# Patient Record
Sex: Male | Born: 1965
Health system: Southern US, Community
[De-identification: ages and names within clinical notes are randomized; demographics above are authoritative.]

## PROBLEM LIST (undated history)

## (undated) DIAGNOSIS — E119 Type 2 diabetes mellitus without complications: Secondary | ICD-10-CM

## (undated) DIAGNOSIS — N433 Hydrocele, unspecified: Secondary | ICD-10-CM

## (undated) DIAGNOSIS — Z8719 Personal history of other diseases of the digestive system: Secondary | ICD-10-CM

## (undated) DIAGNOSIS — Z8709 Personal history of other diseases of the respiratory system: Secondary | ICD-10-CM

## (undated) DIAGNOSIS — E785 Hyperlipidemia, unspecified: Secondary | ICD-10-CM

## (undated) DIAGNOSIS — Z889 Allergy status to unspecified drugs, medicaments and biological substances status: Secondary | ICD-10-CM

## (undated) DIAGNOSIS — J45909 Unspecified asthma, uncomplicated: Secondary | ICD-10-CM

## (undated) HISTORY — DX: Type 2 diabetes mellitus without complications: E11.9

## (undated) HISTORY — PX: APPENDECTOMY: SHX54

---

## 2014-07-02 ENCOUNTER — Encounter (HOSPITAL_BASED_OUTPATIENT_CLINIC_OR_DEPARTMENT_OTHER): Payer: Self-pay | Admitting: *Deleted

## 2014-07-02 ENCOUNTER — Emergency Department (HOSPITAL_BASED_OUTPATIENT_CLINIC_OR_DEPARTMENT_OTHER)
Admission: EM | Admit: 2014-07-02 | Discharge: 2014-07-02 | Disposition: A | Discharge status: Home / Self Care | Payer: 59 | Attending: Emergency Medicine | Admitting: Emergency Medicine

## 2014-07-02 DIAGNOSIS — J309 Allergic rhinitis, unspecified: Secondary | ICD-10-CM | POA: Insufficient documentation

## 2014-07-02 DIAGNOSIS — K649 Unspecified hemorrhoids: Secondary | ICD-10-CM

## 2014-07-02 DIAGNOSIS — R195 Other fecal abnormalities: Secondary | ICD-10-CM | POA: Diagnosis not present

## 2014-07-02 DIAGNOSIS — K648 Other hemorrhoids: Secondary | ICD-10-CM | POA: Diagnosis not present

## 2014-07-02 DIAGNOSIS — R05 Cough: Secondary | ICD-10-CM | POA: Diagnosis present

## 2014-07-02 DIAGNOSIS — J302 Other seasonal allergic rhinitis: Secondary | ICD-10-CM

## 2014-07-02 MED ORDER — STARCH 51 % RE SUPP: 1.0000 | RECTAL | Status: DC | PRN

## 2014-07-02 MED ORDER — ALBUTEROL SULFATE HFA 108 (90 BASE) MCG/ACT IN AERS
2.0000 | INHALATION_SPRAY | RESPIRATORY_TRACT | Status: DC | PRN
  Administered 2014-07-02: 2 via RESPIRATORY_TRACT
  Filled 2014-07-02: qty 6.7

## 2014-07-02 MED ORDER — LORATADINE 10 MG PO TABS: 10.0000 mg | ORAL_TABLET | Freq: Every day | ORAL | Status: DC

## 2014-07-02 NOTE — Discharge Instructions (Signed)

## 2014-07-02 NOTE — ED Provider Notes (Signed)
CSN: [id number]     Arrival date & time 07/02/14  1046 History   First MD Initiated Contact with Patient 07/02/14 1102     No chief complaint on file.    (Consider location/radiation/quality/duration/timing/severity/associated sxs/prior Treatment) Patient is a 49 y.o. male presenting with URI. The history is provided by the patient.  URI Presenting symptoms: cough   Severity:  Moderate Onset quality:  Gradual Duration:  2 weeks Timing:  Constant Progression:  Waxing and waning Chronicity:  Recurrent Relieved by:  Nothing Worsened by:  Nothing tried Ineffective treatments: nyquil. Associated symptoms: headaches, sinus pain, sneezing and wheezing   Associated symptoms comment:  Nasal discharge also with bloody mucous drainage from the nose. Feeling drainage down the back of his throat looked causes of cough and occasional shortness of breath especially while he is at work. Patient states he gets allergies every year but this year are worse than normal Risk factors: no chronic cardiac disease, no chronic kidney disease, no chronic respiratory disease, no diabetes mellitus, no recent illness, no recent travel and no sick contacts     History reviewed. No pertinent past medical history. Past Surgical History  Procedure Laterality Date  . Appendectomy     No family history on file. History  Substance Use Topics  . Smoking status: Never Smoker   . Smokeless tobacco: Not on file  . Alcohol Use: No    Review of Systems  HENT: Positive for sneezing.   Respiratory: Positive for cough, shortness of breath and wheezing.   Cardiovascular: Negative for chest pain, palpitations and leg swelling.  Gastrointestinal: Positive for constipation and blood in stool. Negative for nausea, vomiting and abdominal pain.  Musculoskeletal:       Occasional sharp electric type pain shooting down in his right arm with certain movements that occur 2-3 times a week it is not consistent. No weakness or neck  pain  Neurological: Positive for headaches.  All other systems reviewed and are negative.     Allergies  Review of patient's allergies indicates no known allergies.  Home Medications   Prior to Admission medications   Medication Sig Start Date End Date Taking? Authorizing Provider  loratadine (CLARITIN) 10 MG tablet Take 1 tablet (10 mg total) by mouth daily. 07/02/14   Gwyneth Sprout, MD  starch (ANUSOL) 51 % suppository Place 1 suppository rectally as needed for pain. 07/02/14   Gwyneth Sprout, MD   BP 146/92 mmHg  Pulse 87  Temp(Src) 98.1 F (36.7 C) (Oral)  Resp 20  Ht  (1.702 m)  Wt 191 lb (86.637 kg)  BMI 29.91 kg/m2  SpO2 97% Physical Exam  Constitutional: He is oriented to person, place, and time. He appears well-developed and well-nourished. No distress.  HENT:  Head: Normocephalic and atraumatic.  Right Ear: A middle ear effusion is present.  Left Ear: A middle ear effusion is present.  Nose: Mucosal edema and rhinorrhea present.  Mouth/Throat: Oropharynx is clear and moist and mucous membranes are normal.  Dried blood in both nares  Eyes: Conjunctivae and EOM are normal. Pupils are equal, round, and reactive to light.  Neck: Normal range of motion. Neck supple.  Cardiovascular: Normal rate, regular rhythm and intact distal pulses.   No murmur heard. Pulmonary/Chest: Effort normal and breath sounds normal. No respiratory distress. He has no wheezes. He has no rales.  Abdominal: Soft. He exhibits no distension. There is no tenderness. There is no rebound and no guarding.  Genitourinary: Rectal exam shows internal  hemorrhoid. Rectal exam shows no mass and no tenderness.  Musculoskeletal: Normal range of motion. He exhibits no edema or tenderness.  Neurological: He is alert and oriented to person, place, and time.  Skin: Skin is warm and dry. No rash noted. No erythema.  Psychiatric: He has a normal mood and affect. His behavior is normal.  Nursing note and  vitals reviewed.   ED Course  Procedures (including critical care time) Labs Review Labs Reviewed - No data to display  Imaging Review No results found.   EKG Interpretation None      MDM   Final diagnoses:  Seasonal allergies  Hemorrhoids, unspecified hemorrhoid type    Patient here with symptoms most consistent with allergies. Over the last few weeks he's had significant nasal drainage that has some blood in it as well as throat drainage, coughing and shortness of breath. He is only taking NyQuil without improvement. On exam patient has normal vital signs and is no acute distress. He has no wheezing on exam and currently denies any shortness of breath. Discussed with him starting Claritin or Zyrtec as well as given an albuterol inhaler to use when necessary for shortness of breath.  Has noninflamed hemorrhoids on rectal exam. No current bleeding at this time. He at one time was on Anusol but ran out a while ago. He does have intermittent heart stools and he was encouraged to drink lots of water as well as eating fruits and vegetables.    Gwyneth SproutWhitney Seabron Iannello, MD 07/02/14 1301

## 2014-07-02 NOTE — ED Notes (Signed)
Allergies and sob for a week per pt. States he has been taking OTC medication without relief. Feels like an electric shock is in his right arm for 2 months. It causes his arm to get weak when it comes about 3 times a week while he works. Pain in his anus for 3 months. States he wipes and sees blood. Stools are hard.

## 2015-06-26 DIAGNOSIS — Z8709 Personal history of other diseases of the respiratory system: Secondary | ICD-10-CM

## 2015-06-26 HISTORY — DX: Personal history of other diseases of the respiratory system: Z87.09

## 2016-05-22 DIAGNOSIS — B353 Tinea pedis: Secondary | ICD-10-CM | POA: Diagnosis not present

## 2016-07-10 DIAGNOSIS — J45909 Unspecified asthma, uncomplicated: Secondary | ICD-10-CM | POA: Diagnosis not present

## 2016-07-10 DIAGNOSIS — J309 Allergic rhinitis, unspecified: Secondary | ICD-10-CM | POA: Diagnosis not present

## 2016-08-07 DIAGNOSIS — Z0000 Encounter for general adult medical examination without abnormal findings: Secondary | ICD-10-CM | POA: Diagnosis not present

## 2016-08-07 DIAGNOSIS — Z01118 Encounter for examination of ears and hearing with other abnormal findings: Secondary | ICD-10-CM | POA: Diagnosis not present

## 2016-08-07 DIAGNOSIS — Z136 Encounter for screening for cardiovascular disorders: Secondary | ICD-10-CM | POA: Diagnosis not present

## 2016-08-07 DIAGNOSIS — Z131 Encounter for screening for diabetes mellitus: Secondary | ICD-10-CM | POA: Diagnosis not present

## 2016-11-06 DIAGNOSIS — J301 Allergic rhinitis due to pollen: Secondary | ICD-10-CM | POA: Diagnosis not present

## 2016-11-06 DIAGNOSIS — J4530 Mild persistent asthma, uncomplicated: Secondary | ICD-10-CM | POA: Diagnosis not present

## 2016-11-06 DIAGNOSIS — H1013 Acute atopic conjunctivitis, bilateral: Secondary | ICD-10-CM | POA: Diagnosis not present

## 2016-11-23 DIAGNOSIS — J301 Allergic rhinitis due to pollen: Secondary | ICD-10-CM | POA: Diagnosis not present

## 2016-12-04 DIAGNOSIS — J301 Allergic rhinitis due to pollen: Secondary | ICD-10-CM | POA: Diagnosis not present

## 2016-12-04 DIAGNOSIS — J3089 Other allergic rhinitis: Secondary | ICD-10-CM | POA: Diagnosis not present

## 2016-12-11 DIAGNOSIS — J301 Allergic rhinitis due to pollen: Secondary | ICD-10-CM | POA: Diagnosis not present

## 2016-12-18 DIAGNOSIS — J301 Allergic rhinitis due to pollen: Secondary | ICD-10-CM | POA: Diagnosis not present

## 2016-12-25 DIAGNOSIS — J301 Allergic rhinitis due to pollen: Secondary | ICD-10-CM | POA: Diagnosis not present

## 2017-01-01 DIAGNOSIS — H1013 Acute atopic conjunctivitis, bilateral: Secondary | ICD-10-CM | POA: Diagnosis not present

## 2017-01-01 DIAGNOSIS — J301 Allergic rhinitis due to pollen: Secondary | ICD-10-CM | POA: Diagnosis not present

## 2017-01-01 DIAGNOSIS — J4530 Mild persistent asthma, uncomplicated: Secondary | ICD-10-CM | POA: Diagnosis not present

## 2017-01-08 DIAGNOSIS — J301 Allergic rhinitis due to pollen: Secondary | ICD-10-CM | POA: Diagnosis not present

## 2017-01-15 DIAGNOSIS — H1013 Acute atopic conjunctivitis, bilateral: Secondary | ICD-10-CM | POA: Diagnosis not present

## 2017-01-15 DIAGNOSIS — J301 Allergic rhinitis due to pollen: Secondary | ICD-10-CM | POA: Diagnosis not present

## 2017-01-22 DIAGNOSIS — J301 Allergic rhinitis due to pollen: Secondary | ICD-10-CM | POA: Diagnosis not present

## 2017-01-29 DIAGNOSIS — J301 Allergic rhinitis due to pollen: Secondary | ICD-10-CM | POA: Diagnosis not present

## 2017-02-05 DIAGNOSIS — J301 Allergic rhinitis due to pollen: Secondary | ICD-10-CM | POA: Diagnosis not present

## 2017-02-12 DIAGNOSIS — Z7689 Persons encountering health services in other specified circumstances: Secondary | ICD-10-CM | POA: Diagnosis not present

## 2017-02-12 DIAGNOSIS — J301 Allergic rhinitis due to pollen: Secondary | ICD-10-CM | POA: Diagnosis not present

## 2017-02-19 DIAGNOSIS — J301 Allergic rhinitis due to pollen: Secondary | ICD-10-CM | POA: Diagnosis not present

## 2017-03-06 DIAGNOSIS — J301 Allergic rhinitis due to pollen: Secondary | ICD-10-CM | POA: Diagnosis not present

## 2017-03-12 DIAGNOSIS — J301 Allergic rhinitis due to pollen: Secondary | ICD-10-CM | POA: Diagnosis not present

## 2017-03-21 DIAGNOSIS — H1013 Acute atopic conjunctivitis, bilateral: Secondary | ICD-10-CM | POA: Diagnosis not present

## 2017-03-21 DIAGNOSIS — J301 Allergic rhinitis due to pollen: Secondary | ICD-10-CM | POA: Diagnosis not present

## 2017-03-28 DIAGNOSIS — J301 Allergic rhinitis due to pollen: Secondary | ICD-10-CM | POA: Diagnosis not present

## 2017-04-02 DIAGNOSIS — J301 Allergic rhinitis due to pollen: Secondary | ICD-10-CM | POA: Diagnosis not present

## 2017-04-09 DIAGNOSIS — J301 Allergic rhinitis due to pollen: Secondary | ICD-10-CM | POA: Diagnosis not present

## 2017-04-16 DIAGNOSIS — J301 Allergic rhinitis due to pollen: Secondary | ICD-10-CM | POA: Diagnosis not present

## 2017-04-23 DIAGNOSIS — J301 Allergic rhinitis due to pollen: Secondary | ICD-10-CM | POA: Diagnosis not present

## 2017-04-30 DIAGNOSIS — J301 Allergic rhinitis due to pollen: Secondary | ICD-10-CM | POA: Diagnosis not present

## 2017-05-07 DIAGNOSIS — J301 Allergic rhinitis due to pollen: Secondary | ICD-10-CM | POA: Diagnosis not present

## 2017-05-15 DIAGNOSIS — J301 Allergic rhinitis due to pollen: Secondary | ICD-10-CM | POA: Diagnosis not present

## 2017-05-21 DIAGNOSIS — J301 Allergic rhinitis due to pollen: Secondary | ICD-10-CM | POA: Diagnosis not present

## 2017-05-28 DIAGNOSIS — J301 Allergic rhinitis due to pollen: Secondary | ICD-10-CM | POA: Diagnosis not present

## 2017-06-04 DIAGNOSIS — J4530 Mild persistent asthma, uncomplicated: Secondary | ICD-10-CM | POA: Diagnosis not present

## 2017-06-04 DIAGNOSIS — J301 Allergic rhinitis due to pollen: Secondary | ICD-10-CM | POA: Diagnosis not present

## 2017-06-04 DIAGNOSIS — H1013 Acute atopic conjunctivitis, bilateral: Secondary | ICD-10-CM | POA: Diagnosis not present

## 2017-06-12 DIAGNOSIS — J301 Allergic rhinitis due to pollen: Secondary | ICD-10-CM | POA: Diagnosis not present

## 2017-06-20 DIAGNOSIS — H1013 Acute atopic conjunctivitis, bilateral: Secondary | ICD-10-CM | POA: Diagnosis not present

## 2017-06-20 DIAGNOSIS — J301 Allergic rhinitis due to pollen: Secondary | ICD-10-CM | POA: Diagnosis not present

## 2017-06-25 DIAGNOSIS — J301 Allergic rhinitis due to pollen: Secondary | ICD-10-CM | POA: Diagnosis not present

## 2017-07-02 DIAGNOSIS — J301 Allergic rhinitis due to pollen: Secondary | ICD-10-CM | POA: Diagnosis not present

## 2017-07-09 DIAGNOSIS — J301 Allergic rhinitis due to pollen: Secondary | ICD-10-CM | POA: Diagnosis not present

## 2017-07-12 DIAGNOSIS — J301 Allergic rhinitis due to pollen: Secondary | ICD-10-CM | POA: Diagnosis not present

## 2017-07-17 DIAGNOSIS — J301 Allergic rhinitis due to pollen: Secondary | ICD-10-CM | POA: Diagnosis not present

## 2017-07-23 DIAGNOSIS — J301 Allergic rhinitis due to pollen: Secondary | ICD-10-CM | POA: Diagnosis not present

## 2017-07-30 DIAGNOSIS — J301 Allergic rhinitis due to pollen: Secondary | ICD-10-CM | POA: Diagnosis not present

## 2017-08-07 DIAGNOSIS — J301 Allergic rhinitis due to pollen: Secondary | ICD-10-CM | POA: Diagnosis not present

## 2017-08-21 DIAGNOSIS — J301 Allergic rhinitis due to pollen: Secondary | ICD-10-CM | POA: Diagnosis not present

## 2017-09-26 DIAGNOSIS — J301 Allergic rhinitis due to pollen: Secondary | ICD-10-CM | POA: Diagnosis not present

## 2017-10-01 DIAGNOSIS — J301 Allergic rhinitis due to pollen: Secondary | ICD-10-CM | POA: Diagnosis not present

## 2017-10-11 DIAGNOSIS — J301 Allergic rhinitis due to pollen: Secondary | ICD-10-CM | POA: Diagnosis not present

## 2017-10-22 DIAGNOSIS — J45909 Unspecified asthma, uncomplicated: Secondary | ICD-10-CM | POA: Diagnosis not present

## 2017-10-22 DIAGNOSIS — Z0000 Encounter for general adult medical examination without abnormal findings: Secondary | ICD-10-CM | POA: Diagnosis not present

## 2017-10-22 DIAGNOSIS — E785 Hyperlipidemia, unspecified: Secondary | ICD-10-CM | POA: Diagnosis not present

## 2017-10-22 DIAGNOSIS — Z136 Encounter for screening for cardiovascular disorders: Secondary | ICD-10-CM | POA: Diagnosis not present

## 2017-10-22 DIAGNOSIS — Z01118 Encounter for examination of ears and hearing with other abnormal findings: Secondary | ICD-10-CM | POA: Diagnosis not present

## 2017-10-22 DIAGNOSIS — E1165 Type 2 diabetes mellitus with hyperglycemia: Secondary | ICD-10-CM | POA: Diagnosis not present

## 2017-10-24 DIAGNOSIS — J301 Allergic rhinitis due to pollen: Secondary | ICD-10-CM | POA: Diagnosis not present

## 2017-11-05 DIAGNOSIS — J45909 Unspecified asthma, uncomplicated: Secondary | ICD-10-CM | POA: Diagnosis not present

## 2017-11-05 DIAGNOSIS — J301 Allergic rhinitis due to pollen: Secondary | ICD-10-CM | POA: Diagnosis not present

## 2017-11-05 DIAGNOSIS — E1165 Type 2 diabetes mellitus with hyperglycemia: Secondary | ICD-10-CM | POA: Diagnosis not present

## 2017-11-05 DIAGNOSIS — E785 Hyperlipidemia, unspecified: Secondary | ICD-10-CM | POA: Diagnosis not present

## 2017-11-06 NOTE — Progress Notes (Signed)
[facility] - Clinic Note  11/07/2017     CHIEF COMPLAINT Patient presents for Diabetic Eye Exam   HISTORY OF PRESENT ILLNESS: Glen Clark is a 52 y.o. male who presents to the clinic today for:   HPI    Diabetic Eye Exam    Vision is blurred for near and is blurred for distance.  Associated Symptoms Photophobia.  Negative for Flashes, Blind Spot, Scalp Tenderness, Fever, Weight Loss, Jaw Claudication, Glare, Pain, Floaters, Distortion, Redness, Trauma, Shoulder/Hip pain and Fatigue.  Diabetes characteristics include Type 2.  This started 2 weeks ago.  I, the attending physician,  performed the HPI with the patient and updated documentation appropriately.          Comments    Pt presents for DM exam on the referral of Dr. Greggory StallionGeorge Osei-Bonsu, pt was dx 2 weeks ago, pt states he was not told what his A1C was, pt is not checking blood sugar at home, but it was 124 2 weeks ago, pt denies FOL, floaters, pain or wavy vision, pt states it is very hard for him to see when it is sunny outside, pt states he is using Visine gtts PRN, pt states he started taking Metformin this past Monday night       Last edited by Rennis ChrisZamora, Demontez Novack, MD on 11/07/2017  9:31 AM. (History)    Pt states 2 weeks ago he was dx with DM; Pt states last A1C was 5.2 x 2 weeks ago; Pt states he started metformin x 3 days ago;   Referring physician: Jackie Plumsei-Bonsu, George, MD [address] [place], [place] [id number]  HISTORICAL INFORMATION:   Selected notes from the MEDICAL RECORD NUMBER Referred by Dr. Greggory StallionGeorge Osei-Bonsu for DM exam LEE:  Ocular Hx- PMH-DM (last A1C 6.5 no medication on file), high cholesterol, asthma,     CURRENT MEDICATIONS: No current outpatient medications on file. (Ophthalmic Drugs)   No current facility-administered medications for this visit.  (Ophthalmic Drugs)   Current Outpatient Medications (Other)  Medication Sig  . albuterol (PROVENTIL HFA;VENTOLIN HFA) 108 (90  Base) MCG/ACT inhaler Inhale into the lungs.  Marland Kitchen. atorvastatin (LIPITOR) 10 MG tablet Take 10 mg by mouth daily.  Marland Kitchen. loratadine (CLARITIN) 10 MG tablet Take 1 tablet (10 mg total) by mouth daily.  . metFORMIN (GLUCOPHAGE) 500 MG tablet TAKE 1 2 (ONE HALF) TABLET BY MOUTH ONCE DAILY  . starch (ANUSOL) 51 % suppository Place 1 suppository rectally as needed for pain.   No current facility-administered medications for this visit.  (Other)      REVIEW OF SYSTEMS: ROS    Positive for: Eyes   Negative for: Constitutional, Gastrointestinal, Neurological, Skin, Genitourinary, Musculoskeletal, HENT, Endocrine, Cardiovascular, Respiratory, Psychiatric, Allergic/Imm, Heme/Lymph   Last edited by Posey BoyerBrown, Amanda J, COT on 11/07/2017  9:13 AM. (History)       ALLERGIES Allergies  Allergen Reactions  . Aspirin     PAST MEDICAL HISTORY Past Medical History:  Diagnosis Date  . Diabetes mellitus without complication [facility](HCC)    Past Surgical History:  Procedure Laterality Date  . APPENDECTOMY      FAMILY HISTORY Family History  Problem Relation Age of Onset  . Amblyopia Neg Hx   . Blindness Neg Hx   . Cataracts Neg Hx   . Glaucoma Neg Hx   . Macular degeneration Neg Hx   . Retinal detachment Neg Hx   . Strabismus Neg Hx   . Retinitis pigmentosa Neg Hx  SOCIAL HISTORY Social History   Tobacco Use  . Smoking status: Never Smoker  . Smokeless tobacco: Never Used  Substance Use Topics  . Alcohol use: No  . Drug use: No         OPHTHALMIC EXAM:  Base Eye Exam    Visual Acuity (Snellen - Linear)      Right Left   Dist cc 20/20 20/20   Correction:  Glasses       Tonometry (Tonopen, 9:19 AM)      Right Left   Pressure 20 17       Pupils      Dark Light Shape React APD   Right 4 2 Round Brisk None   Left 4 2 Round Brisk None       Visual Fields (Counting fingers)      Left Right    Full Full       Extraocular Movement      Right Left    Full, Ortho Full, Ortho        Neuro/Psych    Oriented x3:  Yes   Mood/Affect:  Normal       Dilation    Both eyes:  1.0% Mydriacyl, 2.5% Phenylephrine @ 9:19 AM        Slit Lamp and Fundus Exam    Slit Lamp Exam      Right Left   Lids/Lashes Meibomian gland dysfunction Meibomian gland dysfunction   Conjunctiva/Sclera Nasal Pinguecula, mild Melanosis Mild Melanosis   Cornea Arcus Arcus   Anterior Chamber Moderate depth, Temporal Narrow angle Moderate depth, Temporal Narrow angle   Iris Round and dilated, No NVI Round and dilated, No NVI   Lens 2+ Nuclear sclerosis, 2+ Cortical cataract 2+ Nuclear sclerosis, 2+ Cortical cataract   Vitreous Vitreous syneresis Vitreous syneresis, prominent vitreous base       Fundus Exam      Right Left   Disc Pink and Sharp Pink and Sharp   C/D Ratio 0.25 0.2   Macula Good foveal reflex, mild Retinal pigment epithelial mottling, No heme or edema Good foveal reflex, mild Retinal pigment epithelial mottling, No heme or edema   Vessels Normal Mild Copper wiring   Periphery Attached, no heme Attached, no heme, Inferior-temporal and Superior-temporal White without pressure / prominent vitreous base        Refraction    Wearing Rx      Sphere Cylinder Add   Right +2.00 Sphere +1.75   Left +2.00 Sphere +1.75          IMAGING AND PROCEDURES  Imaging and Procedures for @TODAY @  OCT, Retina - OU - Both Eyes       Right Eye Quality was good. Central Foveal Thickness: 284. Progression has no prior data. Findings include normal foveal contour, no IRF, no SRF.   Left Eye Quality was good. Central Foveal Thickness: 284. Progression has no prior data. Findings include normal foveal contour, no IRF, no SRF.   Notes *Images captured and stored on drive  Diagnosis / Impression:  No DME OU   Clinical management:  See below  Abbreviations: NFP - Normal foveal profile. CME - cystoid macular edema. PED - pigment epithelial detachment. IRF - intraretinal fluid. SRF -  subretinal fluid. EZ - ellipsoid zone. ERM - epiretinal membrane. ORA - outer retinal atrophy. ORT - outer retinal tubulation. SRHM - subretinal hyper-reflective material                  ASSESSMENT/PLAN:  ICD-10-CM   1. Diabetes mellitus type 2 without retinopathy (HCC) E11.9   2. Retinal edema H35.81 OCT, Retina - OU - Both Eyes  3. Combined form of age-related cataract, both eyes H25.813     1. Diabetes mellitus, type 2 without retinopathy - The incidence, risk factors for progression, natural history and treatment options for diabetic retinopathy  were discussed with patient.   - The need for close monitoring of blood glucose, blood pressure, and serum lipids, avoiding cigarette or any type of tobacco, and the need for long term follow up was also discussed with patient. - f/u in 1 year, sooner prn  2. No retinal edema on exam or OCT  3. Combined form age related cataract OU-  - The symptoms of cataract, surgical options, and treatments and risks were discussed with patient. - discussed diagnosis and progression - not yet visually significant - monitor for now   Ophthalmic Meds Ordered this visit:  No orders of the defined types were placed in this encounter.      Return in about 1 year (around 11/08/2018) for DM exam, DFE, OCT.  There are no Patient Instructions on file for this visit.   Explained the diagnoses, plan, and follow up with the patient and they expressed understanding.  Patient expressed understanding of the importance of proper follow up care.   This document serves as a record of services personally performed by Karie ChimeraBrian G. Edison Nicholson, MD, PhD. It was created on their behalf by Laurian BrimAmanda Brown, OA, an ophthalmic assistant. The creation of this record is the provider's dictation and/or activities during the visit.    Electronically signed by: Laurian BrimAmanda Brown, OA  08.13.2019 9:50 AM   This document serves as a record of services personally performed by Karie ChimeraBrian  G. Patryce Depriest, MD, PhD. It was created on their behalf by Virgilio BellingMeredith Fabian, COA, a certified ophthalmic assistant. The creation of this record is the provider's dictation and/or activities during the visit.  Electronically signed by: Virgilio BellingMeredith Fabian, COA  08.14.19 9:50 AM    Karie ChimeraBrian G. Lachelle Rissler, M.D., Ph.D. Diseases & Surgery of the Retina and Vitreous [facility]  I have reviewed the above documentation for accuracy and completeness, and I agree with the above. Karie ChimeraBrian G. Kasidee Voisin, M.D., Ph.D. 11/07/17 9:50 AM     Abbreviations: M myopia (nearsighted); A astigmatism; H hyperopia (farsighted); P presbyopia; Mrx spectacle prescription;  CTL contact lenses; OD right eye; OS left eye; OU both eyes  XT exotropia; ET esotropia; PEK punctate epithelial keratitis; PEE punctate epithelial erosions; DES dry eye syndrome; MGD meibomian gland dysfunction; ATs artificial tears; PFAT's preservative free artificial tears; NSC nuclear sclerotic cataract; PSC posterior subcapsular cataract; ERM epi-retinal membrane; PVD posterior vitreous detachment; RD retinal detachment; DM diabetes mellitus; DR diabetic retinopathy; NPDR non-proliferative diabetic retinopathy; PDR proliferative diabetic retinopathy; CSME clinically significant macular edema; DME diabetic macular edema; dbh dot blot hemorrhages; CWS cotton wool spot; POAG primary open angle glaucoma; C/D cup-to-disc ratio; HVF humphrey visual field; GVF goldmann visual field; OCT optical coherence tomography; IOP intraocular pressure; BRVO Branch retinal vein occlusion; CRVO central retinal vein occlusion; CRAO central retinal artery occlusion; BRAO branch retinal artery occlusion; RT retinal tear; SB scleral buckle; PPV pars plana vitrectomy; VH Vitreous hemorrhage; PRP panretinal laser photocoagulation; IVK intravitreal kenalog; VMT vitreomacular traction; MH Macular hole;  NVD neovascularization of the disc; NVE neovascularization elsewhere; AREDS age  related eye disease study; ARMD age related macular degeneration; POAG primary open angle glaucoma; EBMD epithelial/anterior  basement membrane dystrophy; ACIOL anterior chamber intraocular lens; IOL intraocular lens; PCIOL posterior chamber intraocular lens; Phaco/IOL phacoemulsification with intraocular lens placement; [place] photorefractive keratectomy; LASIK laser assisted in situ keratomileusis; HTN hypertension; DM diabetes mellitus; COPD chronic obstructive pulmonary disease

## 2017-11-07 ENCOUNTER — Ambulatory Visit (INDEPENDENT_AMBULATORY_CARE_PROVIDER_SITE_OTHER): Payer: 59 | Admitting: Ophthalmology

## 2017-11-07 ENCOUNTER — Encounter (INDEPENDENT_AMBULATORY_CARE_PROVIDER_SITE_OTHER): Payer: Self-pay | Admitting: Ophthalmology

## 2017-11-07 DIAGNOSIS — E119 Type 2 diabetes mellitus without complications: Secondary | ICD-10-CM | POA: Diagnosis not present

## 2017-11-07 DIAGNOSIS — H3581 Retinal edema: Secondary | ICD-10-CM | POA: Diagnosis not present

## 2017-11-07 DIAGNOSIS — H25813 Combined forms of age-related cataract, bilateral: Secondary | ICD-10-CM

## 2017-11-19 DIAGNOSIS — R102 Pelvic and perineal pain: Secondary | ICD-10-CM | POA: Diagnosis not present

## 2017-11-19 DIAGNOSIS — N433 Hydrocele, unspecified: Secondary | ICD-10-CM | POA: Diagnosis not present

## 2017-12-03 ENCOUNTER — Encounter: Payer: 59 | Attending: Internal Medicine | Admitting: Registered"

## 2017-12-03 ENCOUNTER — Encounter: Payer: Self-pay | Admitting: Registered"

## 2017-12-03 DIAGNOSIS — E119 Type 2 diabetes mellitus without complications: Secondary | ICD-10-CM | POA: Insufficient documentation

## 2017-12-03 DIAGNOSIS — Z713 Dietary counseling and surveillance: Secondary | ICD-10-CM | POA: Insufficient documentation

## 2017-12-03 NOTE — Progress Notes (Signed)
Diabetes Self-Management Education  Visit Type: First/Initial  Appt. Start Time: 0930 Appt. End Time: 1035  12/03/2017  Mr. Glen Clark, identified by name and date of birth, is a 52 y.o. male with a diagnosis of Diabetes: Type 2.   ASSESSMENT Pt states his blood sugar has improved since is first visit with Dr. Julio Sicks, but did not have the values to share with me. Pt knew the number was 126 but RD is not clear if that was a fasting number or if it was eAGmg/dL from the Z6X. RD tried to educate on the numbers, but patient seemed frustrated with explanation, not sure if it was a language or cultural barrier.   Pt states after getting the diagnosis he cut back on carbohydrates. Pt states he was eating traditional African food with casava, and rice, cut out the casava and cut back on the rice and started eating more vegetables. Pt states he has never been a soda drinker.   Pt states he stopped eating eggs and peanuts due to cholesterol, states his doctor did not give him this instruction, he did it on his own.   Pt states he works 3:30 p - 2 am. Pt reports timing meals to before work, one meal during work, and after work he showers then eats before going to bed. Pt   Diabetes Self-Management Education - 12/03/17 0944      Visit Information   Visit Type  First/Initial      Initial Visit   Diabetes Type  Type 2    Are you currently following a meal plan?  No    Are you taking your medications as prescribed?  Yes      Health Coping   How would you rate your overall health?  Good      Psychosocial Assessment   Patient Belief/Attitude about Diabetes  Afraid    How often do you need to have someone help you when you read instructions, pamphlets, or other written materials from your doctor or pharmacy?  1 - Never    What is the last grade level you completed in school?  BSC      Complications   Last HgB A1C per patient/outside source  6.5 %   per referral lab 07/2017   How often do  you check your blood sugar?  0 times/day (not testing)    Have you had a dilated eye exam in the past 12 months?  Yes    Have you had a dental exam in the past 12 months?  Yes    Are you checking your feet?  Yes    How many days per week are you checking your feet?  3      Dietary Intake   Breakfast  wheat bread, soup OR cheerios cereal     Snack (morning)  none    Lunch  rice, chick-fil-a salad chicken, fruit    Snack (afternoon)  none    Dinner  wheat, vegetable soup, fish    Snack (evening)  none    Beverage(s)  water      Exercise   Exercise Type  ADL's   active at work    How many days per week to you exercise?  0    How many minutes per day do you exercise?  0    Total minutes per week of exercise  0      Patient Education   Previous Diabetes Education  No    Disease state  Definition of diabetes, type 1 and 2, and the diagnosis of diabetes    Nutrition management   Role of diet in the treatment of diabetes and the relationship between the three main macronutrients and blood glucose level;Food label reading, portion sizes and measuring food.    Physical activity and exercise   Role of exercise on diabetes management, blood pressure control and cardiac health.    Monitoring  Interpreting lab values - A1C, lipid, urine microalbumina.   A1c   Acute complications  Taught treatment of hypoglycemia - the 15 rule.    Psychosocial adjustment  Role of stress on diabetes      Individualized Goals (developed by patient)   Nutrition  General guidelines for healthy choices and portions discussed    Monitoring   test my blood glucose as discussed      Outcomes   Expected Outcomes  Demonstrated interest in learning. Expect positive outcomes    Future DMSE  PRN    Program Status  Completed      Individualized Plan for Diabetes Self-Management Training:   Learning Objective:  Patient will have a greater understanding of diabetes self-management. Patient education plan is to attend  individual and/or group sessions per assessed needs and concerns.   Patient Instructions  When you have fainted while using the restroom it may be a sign of Micturion Syncope. Consider looking it up and asking your doctor about it. Continue to eat 3 balanced meals per day  It sounds like you have balanced your carbohydrates well. If you start feeling low energy you may have cut it back too much. Check your blood if you have low blood sugar symptoms, check and if below 70 treat with 15 grams of fast acting carbohydrates.   Expected Outcomes:  Demonstrated interest in learning. Expect positive outcomes  Education material provided: ADA Diabetes: Your Take Control Guide  If problems or questions, patient to contact team via:  Phone  Future DSME appointment: PRN

## 2017-12-03 NOTE — Patient Instructions (Addendum)
When you have fainted while using the restroom it may be a sign of Micturion Syncope. Consider looking it up and asking your doctor about it. Continue to eat 3 balanced meals per day  It sounds like you have balanced your carbohydrates well. If you start feeling low energy you may have cut it back too much. Check your blood if you have low blood sugar symptoms, check and if below 70 treat with 15 grams of fast acting carbohydrates.

## 2017-12-10 DIAGNOSIS — J301 Allergic rhinitis due to pollen: Secondary | ICD-10-CM | POA: Diagnosis not present

## 2017-12-31 DIAGNOSIS — H1013 Acute atopic conjunctivitis, bilateral: Secondary | ICD-10-CM | POA: Diagnosis not present

## 2017-12-31 DIAGNOSIS — J301 Allergic rhinitis due to pollen: Secondary | ICD-10-CM | POA: Diagnosis not present

## 2017-12-31 DIAGNOSIS — J4530 Mild persistent asthma, uncomplicated: Secondary | ICD-10-CM | POA: Diagnosis not present

## 2018-01-07 DIAGNOSIS — E785 Hyperlipidemia, unspecified: Secondary | ICD-10-CM | POA: Diagnosis not present

## 2018-01-07 DIAGNOSIS — E1165 Type 2 diabetes mellitus with hyperglycemia: Secondary | ICD-10-CM | POA: Diagnosis not present

## 2018-01-07 DIAGNOSIS — J309 Allergic rhinitis, unspecified: Secondary | ICD-10-CM | POA: Diagnosis not present

## 2018-01-07 DIAGNOSIS — J45909 Unspecified asthma, uncomplicated: Secondary | ICD-10-CM | POA: Diagnosis not present

## 2018-01-08 DIAGNOSIS — J301 Allergic rhinitis due to pollen: Secondary | ICD-10-CM | POA: Diagnosis not present

## 2018-01-21 DIAGNOSIS — J301 Allergic rhinitis due to pollen: Secondary | ICD-10-CM | POA: Diagnosis not present

## 2018-01-28 DIAGNOSIS — E785 Hyperlipidemia, unspecified: Secondary | ICD-10-CM | POA: Diagnosis not present

## 2018-01-28 DIAGNOSIS — J45909 Unspecified asthma, uncomplicated: Secondary | ICD-10-CM | POA: Diagnosis not present

## 2018-01-28 DIAGNOSIS — E1165 Type 2 diabetes mellitus with hyperglycemia: Secondary | ICD-10-CM | POA: Diagnosis not present

## 2018-02-12 ENCOUNTER — Encounter (HOSPITAL_BASED_OUTPATIENT_CLINIC_OR_DEPARTMENT_OTHER): Payer: Self-pay

## 2018-02-12 ENCOUNTER — Other Ambulatory Visit: Payer: Self-pay | Admitting: Urology

## 2018-02-14 ENCOUNTER — Encounter (HOSPITAL_BASED_OUTPATIENT_CLINIC_OR_DEPARTMENT_OTHER): Payer: Self-pay

## 2018-02-14 ENCOUNTER — Other Ambulatory Visit: Payer: Self-pay

## 2018-02-14 NOTE — Progress Notes (Signed)
Spoke with: Gaspar GarbeAlfred NPO:  After Midnight, no gum, candy, or mints   Arrival time:  10AM Labs:  Istat 4, EKG AM medications: Inhaler, Bring Inhaler day of surgery Pre op orders: Yes Ride home: Lady GaryOlayinka (wife) [phone]

## 2018-02-18 ENCOUNTER — Encounter (HOSPITAL_BASED_OUTPATIENT_CLINIC_OR_DEPARTMENT_OTHER): Payer: Self-pay

## 2018-02-18 ENCOUNTER — Ambulatory Visit (HOSPITAL_BASED_OUTPATIENT_CLINIC_OR_DEPARTMENT_OTHER): Payer: 59 | Admitting: Anesthesiology

## 2018-02-18 ENCOUNTER — Ambulatory Visit (HOSPITAL_BASED_OUTPATIENT_CLINIC_OR_DEPARTMENT_OTHER)
Admission: RE | Admit: 2018-02-18 | Discharge: 2018-02-18 | Disposition: A | Discharge status: Home / Self Care | Payer: 59 | Source: Ambulatory Visit | Attending: Urology | Admitting: Urology

## 2018-02-18 ENCOUNTER — Encounter (HOSPITAL_BASED_OUTPATIENT_CLINIC_OR_DEPARTMENT_OTHER)
Admission: RE | Disposition: A | Discharge status: Home / Self Care | Payer: Self-pay | Source: Ambulatory Visit | Attending: Urology

## 2018-02-18 DIAGNOSIS — J45909 Unspecified asthma, uncomplicated: Secondary | ICD-10-CM | POA: Diagnosis not present

## 2018-02-18 DIAGNOSIS — E785 Hyperlipidemia, unspecified: Secondary | ICD-10-CM | POA: Insufficient documentation

## 2018-02-18 DIAGNOSIS — Z79899 Other long term (current) drug therapy: Secondary | ICD-10-CM | POA: Insufficient documentation

## 2018-02-18 DIAGNOSIS — N5089 Other specified disorders of the male genital organs: Secondary | ICD-10-CM | POA: Diagnosis not present

## 2018-02-18 DIAGNOSIS — N433 Hydrocele, unspecified: Secondary | ICD-10-CM | POA: Insufficient documentation

## 2018-02-18 DIAGNOSIS — Z7984 Long term (current) use of oral hypoglycemic drugs: Secondary | ICD-10-CM | POA: Insufficient documentation

## 2018-02-18 DIAGNOSIS — N430 Encysted hydrocele: Secondary | ICD-10-CM | POA: Diagnosis not present

## 2018-02-18 DIAGNOSIS — E119 Type 2 diabetes mellitus without complications: Secondary | ICD-10-CM | POA: Diagnosis not present

## 2018-02-18 HISTORY — DX: Personal history of other diseases of the respiratory system: Z87.09

## 2018-02-18 HISTORY — DX: Personal history of other diseases of the digestive system: Z87.19

## 2018-02-18 HISTORY — DX: Unspecified asthma, uncomplicated: J45.909

## 2018-02-18 HISTORY — DX: Hyperlipidemia, unspecified: E78.5

## 2018-02-18 HISTORY — DX: Allergy status to unspecified drugs, medicaments and biological substances: Z88.9

## 2018-02-18 HISTORY — DX: Hydrocele, unspecified: N43.3

## 2018-02-18 HISTORY — PX: HYDROCELE EXCISION: SHX482

## 2018-02-18 LAB — POCT I-STAT 4, (NA,K, GLUC, HGB,HCT)
Glucose, Bld: 104 mg/dL — ABNORMAL HIGH (ref 70–99)
HEMATOCRIT: 39 % (ref 39.0–52.0)
Hemoglobin: 13.3 g/dL (ref 13.0–17.0)
Potassium: 3.8 mmol/L (ref 3.5–5.1)
SODIUM: 144 mmol/L (ref 135–145)

## 2018-02-18 LAB — GLUCOSE, CAPILLARY: GLUCOSE-CAPILLARY: 131 mg/dL — AB (ref 70–99)

## 2018-02-18 SURGERY — HYDROCELECTOMY
Anesthesia: General | Site: Scrotum | Laterality: Right

## 2018-02-18 MED ORDER — CEFAZOLIN SODIUM-DEXTROSE 2-4 GM/100ML-% IV SOLN
INTRAVENOUS | Status: AC
  Filled 2018-02-18: qty 100

## 2018-02-18 MED ORDER — MIDAZOLAM HCL 5 MG/5ML IJ SOLN
INTRAMUSCULAR | Status: DC | PRN
  Administered 2018-02-18: 2 mg via INTRAVENOUS

## 2018-02-18 MED ORDER — PROPOFOL 10 MG/ML IV BOLUS
INTRAVENOUS | Status: AC
  Filled 2018-02-18: qty 40

## 2018-02-18 MED ORDER — FENTANYL CITRATE (PF) 100 MCG/2ML IJ SOLN
25.0000 ug | INTRAMUSCULAR | Status: DC | PRN
  Administered 2018-02-18 (×2): 50 ug via INTRAVENOUS
  Filled 2018-02-18: qty 1

## 2018-02-18 MED ORDER — FENTANYL CITRATE (PF) 100 MCG/2ML IJ SOLN
INTRAMUSCULAR | Status: AC
  Filled 2018-02-18: qty 2

## 2018-02-18 MED ORDER — BUPIVACAINE HCL (PF) 0.25 % IJ SOLN
INTRAMUSCULAR | Status: DC | PRN
  Administered 2018-02-18: 10 mL

## 2018-02-18 MED ORDER — PROPOFOL 10 MG/ML IV BOLUS
INTRAVENOUS | Status: DC | PRN
  Administered 2018-02-18: 200 mg via INTRAVENOUS

## 2018-02-18 MED ORDER — MIDAZOLAM HCL 2 MG/2ML IJ SOLN
INTRAMUSCULAR | Status: AC
  Filled 2018-02-18: qty 2

## 2018-02-18 MED ORDER — OXYCODONE-ACETAMINOPHEN 5-325 MG PO TABS
1.0000 | ORAL_TABLET | ORAL | Status: DC | PRN
  Administered 2018-02-18: 1 via ORAL
  Filled 2018-02-18: qty 1

## 2018-02-18 MED ORDER — LIDOCAINE 2% (20 MG/ML) 5 ML SYRINGE
INTRAMUSCULAR | Status: DC | PRN
  Administered 2018-02-18: 100 mg via INTRAVENOUS

## 2018-02-18 MED ORDER — BUPIVACAINE HCL (PF) 0.25 % IJ SOLN
INTRAMUSCULAR | Status: AC
  Filled 2018-02-18: qty 30

## 2018-02-18 MED ORDER — EPHEDRINE SULFATE-NACL 50-0.9 MG/10ML-% IV SOSY
PREFILLED_SYRINGE | INTRAVENOUS | Status: DC | PRN
  Administered 2018-02-18 (×3): 10 mg via INTRAVENOUS

## 2018-02-18 MED ORDER — LIDOCAINE 2% (20 MG/ML) 5 ML SYRINGE
INTRAMUSCULAR | Status: AC
  Filled 2018-02-18: qty 5

## 2018-02-18 MED ORDER — ONDANSETRON HCL 4 MG/2ML IJ SOLN
INTRAMUSCULAR | Status: AC
  Filled 2018-02-18: qty 2

## 2018-02-18 MED ORDER — OXYCODONE-ACETAMINOPHEN 5-325 MG PO TABS: 1.0000 | ORAL_TABLET | ORAL | 0 refills | Status: AC | PRN

## 2018-02-18 MED ORDER — DEXAMETHASONE SODIUM PHOSPHATE 4 MG/ML IJ SOLN
INTRAMUSCULAR | Status: DC | PRN
  Administered 2018-02-18: 10 mg via INTRAVENOUS

## 2018-02-18 MED ORDER — CEFAZOLIN SODIUM-DEXTROSE 2-4 GM/100ML-% IV SOLN
2.0000 g | INTRAVENOUS | Status: AC
  Administered 2018-02-18: 2 g via INTRAVENOUS
  Filled 2018-02-18: qty 100

## 2018-02-18 MED ORDER — PROMETHAZINE HCL 25 MG/ML IJ SOLN
6.2500 mg | INTRAMUSCULAR | Status: DC | PRN
  Filled 2018-02-18: qty 1

## 2018-02-18 MED ORDER — OXYCODONE-ACETAMINOPHEN 5-325 MG PO TABS
ORAL_TABLET | ORAL | Status: AC
  Filled 2018-02-18: qty 1

## 2018-02-18 MED ORDER — DEXAMETHASONE SODIUM PHOSPHATE 10 MG/ML IJ SOLN
INTRAMUSCULAR | Status: AC
  Filled 2018-02-18: qty 1

## 2018-02-18 MED ORDER — FENTANYL CITRATE (PF) 100 MCG/2ML IJ SOLN
INTRAMUSCULAR | Status: DC | PRN
  Administered 2018-02-18 (×2): 50 ug via INTRAVENOUS

## 2018-02-18 MED ORDER — LACTATED RINGERS IV SOLN
INTRAVENOUS | Status: DC
  Administered 2018-02-18 (×2): via INTRAVENOUS
  Filled 2018-02-18: qty 1000

## 2018-02-18 MED ORDER — ONDANSETRON HCL 4 MG/2ML IJ SOLN
INTRAMUSCULAR | Status: DC | PRN
  Administered 2018-02-18: 4 mg via INTRAVENOUS

## 2018-02-18 MED FILL — OXYCODONE-ACETAMINOPHEN 5-3: 5-325 | 5 days supply | Qty: 30 | Fill #0

## 2018-02-18 SURGICAL SUPPLY — 43 items
BAG URINE DRAINAGE (UROLOGICAL SUPPLIES) IMPLANT
BLADE CLIPPER SENSICLIP SURGIC (BLADE) ×2 IMPLANT
BLADE SURG 15 STRL LF DISP TIS (BLADE) ×1 IMPLANT
BLADE SURG 15 STRL SS (BLADE) ×1
BNDG GAUZE ELAST 4 BULKY (GAUZE/BANDAGES/DRESSINGS) ×2 IMPLANT
CATH FOLEY 2WAY SLVR  5CC 16FR (CATHETERS)
CATH FOLEY 2WAY SLVR 5CC 16FR (CATHETERS) IMPLANT
COVER BACK TABLE 60X90IN (DRAPES) ×2 IMPLANT
COVER MAYO STAND STRL (DRAPES) ×2 IMPLANT
COVER WAND RF STERILE (DRAPES) ×2 IMPLANT
DERMABOND ADVANCED (GAUZE/BANDAGES/DRESSINGS) ×1
DERMABOND ADVANCED .7 DNX12 (GAUZE/BANDAGES/DRESSINGS) ×1 IMPLANT
DISSECTOR ROUND CHERRY 3/8 STR (MISCELLANEOUS) IMPLANT
DRAIN PENROSE 18X1/2 LTX STRL (DRAIN) ×2 IMPLANT
DRAPE LAPAROTOMY 100X72 PEDS (DRAPES) ×2 IMPLANT
DRSG TEGADERM 4X4.75 (GAUZE/BANDAGES/DRESSINGS) IMPLANT
ELECT NEEDLE BLADE 2-5/6 (NEEDLE) ×2 IMPLANT
ELECT REM PT RETURN 9FT ADLT (ELECTROSURGICAL) ×2
ELECTRODE REM PT RTRN 9FT ADLT (ELECTROSURGICAL) ×1 IMPLANT
GLOVE BIO SURGEON STRL SZ8 (GLOVE) ×2 IMPLANT
GOWN STRL REUS W/ TWL LRG LVL3 (GOWN DISPOSABLE) ×1 IMPLANT
GOWN STRL REUS W/ TWL XL LVL3 (GOWN DISPOSABLE) ×1 IMPLANT
GOWN STRL REUS W/TWL LRG LVL3 (GOWN DISPOSABLE) ×1
GOWN STRL REUS W/TWL XL LVL3 (GOWN DISPOSABLE) ×1
KIT TURNOVER CYSTO (KITS) ×2 IMPLANT
MANIFOLD NEPTUNE II (INSTRUMENTS) IMPLANT
NEEDLE HYPO 25X1 1.5 SAFETY (NEEDLE) ×2 IMPLANT
NS IRRIG 500ML POUR BTL (IV SOLUTION) ×2 IMPLANT
PACK BASIN DAY SURGERY FS (CUSTOM PROCEDURE TRAY) ×2 IMPLANT
PENCIL BUTTON HOLSTER BLD 10FT (ELECTRODE) ×2 IMPLANT
SUPPORT SCROTAL LG STRP (MISCELLANEOUS) ×2 IMPLANT
SUT ETHILON 4 0 PS 2 18 (SUTURE) ×2 IMPLANT
SUT MNCRL AB 4-0 PS2 18 (SUTURE) ×2 IMPLANT
SUT SILK 0 SH 30 (SUTURE) IMPLANT
SUT VIC AB 2-0 SH 27 (SUTURE) ×3
SUT VIC AB 2-0 SH 27XBRD (SUTURE) ×3 IMPLANT
SYR 30ML LL (SYRINGE) IMPLANT
SYR CONTROL 10ML LL (SYRINGE) ×2 IMPLANT
TOWEL OR 17X24 6PK STRL BLUE (TOWEL DISPOSABLE) ×4 IMPLANT
TRAY DSU PREP LF (CUSTOM PROCEDURE TRAY) ×2 IMPLANT
TUBE CONNECTING 12X1/4 (SUCTIONS) ×2 IMPLANT
WATER STERILE IRR 500ML POUR (IV SOLUTION) IMPLANT
YANKAUER SUCT BULB TIP NO VENT (SUCTIONS) ×2 IMPLANT

## 2018-02-18 NOTE — H&P (Signed)
Urology Admission H&P  Chief Complaint: right scrotal swelling  History of Present Illness: Mr Glen Clark is a 52yo with a hx of right hydrocele which has been growing in size. He denies any worsenign LUTS. He has pain with ambulation and pain with intercourse  Past Medical History:  Diagnosis Date  . Asthma   . Diabetes mellitus without complication (HCC)   . History of bronchitis 06/2015  . History of hemorrhoids   . History of seasonal allergies   . Hyperlipidemia   . Right hydrocele    Past Surgical History:  Procedure Laterality Date  . APPENDECTOMY      Home Medications:  Current Facility-Administered Medications  Medication Dose Route Frequency Provider Last Rate Last Dose  . ceFAZolin (ANCEF) IVPB 2g/100 mL premix  2 g Intravenous 30 min Pre-Op Ronne BinningMcKenzie, Mardene CelestePatrick L, MD      . lactated ringers infusion   Intravenous Continuous Eilene Ghaziose, George, MD 50 mL/hr at 02/18/18 1042     Allergies:  Allergies  Allergen Reactions  . Aspirin Other (See Comments)    Feel like going to pass out    Family History  Problem Relation Age of Onset  . Amblyopia Neg Hx   . Blindness Neg Hx   . Cataracts Neg Hx   . Glaucoma Neg Hx   . Macular degeneration Neg Hx   . Retinal detachment Neg Hx   . Strabismus Neg Hx   . Retinitis pigmentosa Neg Hx    Social History:  reports that he has never smoked. He has never used smokeless tobacco. He reports that he drinks alcohol. He reports that he does not use drugs.  Review of Systems  All other systems reviewed and are negative.   Physical Exam:  Vital signs in last 24 hours: Temp:  [97.9 F (36.6 C)] 97.9 F (36.6 C) (11/25 1003) Pulse Rate:  [64] 64 (11/25 1003) Resp:  [18] 18 (11/25 1003) BP: (138)/(85) 138/85 (11/25 1003) SpO2:  [100 %] 100 % (11/25 1003) Weight:  [87.6 kg] 87.6 kg (11/25 1003) Physical Exam  Constitutional: He is oriented to person, place, and time. He appears well-developed and well-nourished.  HENT:  Head:  Normocephalic and atraumatic.  Eyes: Pupils are equal, round, and reactive to light. EOM are normal.  Neck: Normal range of motion. No thyromegaly present.  Cardiovascular: Normal rate and regular rhythm.  Respiratory: Effort normal. No respiratory distress.  GI: Soft. He exhibits no distension.  Musculoskeletal: Normal range of motion. He exhibits no edema.  Neurological: He is alert and oriented to person, place, and time.  Skin: Skin is warm and dry.  Psychiatric: He has a normal mood and affect. His behavior is normal. Judgment and thought content normal.    Laboratory Data:  Results for orders placed or performed during the hospital encounter of 02/18/18 (from the past 24 hour(s))  I-STAT 4, (NA,K, GLUC, HGB,HCT)     Status: Abnormal   Collection Time: 02/18/18 10:42 AM  Result Value Ref Range   Sodium 144 135 - 145 mmol/L   Potassium 3.8 3.5 - 5.1 mmol/L   Glucose, Bld 104 (H) 70 - 99 mg/dL   HCT 16.139.0 09.639.0 - 04.552.0 %   Hemoglobin 13.3 13.0 - 17.0 g/dL   No results found for this or any previous visit (from the past 240 hour(s)). Creatinine: No results for input(s): CREATININE in the last 168 hours. Baseline Creatinine: unknown  Impression/Assessment:  52yo with a right hydrocele  Plan:  The risks/benefits/alternatives to  right hydrocelectomy was explained to the patient and he understands and wishes to proceed with surgery  Wilkie Aye 02/18/2018, 11:48 AM

## 2018-02-18 NOTE — Anesthesia Postprocedure Evaluation (Signed)
Anesthesia Post Note  Patient: Glen DickerAlfred Clark  Procedure(s) Performed: HYDROCELECTOMY ADULT (Right Scrotum)     Patient location during evaluation: PACU Anesthesia Type: General Level of consciousness: awake and alert Pain management: pain level controlled Vital Signs Assessment: post-procedure vital signs reviewed and stable Respiratory status: spontaneous breathing, nonlabored ventilation, respiratory function stable and patient connected to nasal cannula oxygen Cardiovascular status: blood pressure returned to baseline and stable Postop Assessment: no apparent nausea or vomiting Anesthetic complications: no    Last Vitals:  Vitals:   02/18/18 1345 02/18/18 1400  BP: 136/90 137/87  Pulse: 66 64  Resp: 13 15  Temp:    SpO2: 94% 99%    Last Pain:  Vitals:   02/18/18 1345  TempSrc:   PainSc: Asleep                 Scherry Laverne S

## 2018-02-18 NOTE — Discharge Instructions (Signed)
Hydrocele, Adult A hydrocele is a collection of fluid in the loose pouch of skin that holds the testicles (scrotum). Usually, it affects only one testicle. What are the causes? This condition may be caused by:  An injury to the scrotum.  An infection.  A tumor or cancer of the testicle.  Twisting of a testicle.  Decreased blood flow to the scrotum.  What are the signs or symptoms? A hydrocele feels like a water-filled balloon. It may also feel heavy. A hydrocele can cause:  Swelling of the scrotum. The swelling may decrease when you lie down.  Swelling of the groin.  Mild discomfort in the scrotum.  Pain. This can develop if the hydrocele was caused by infection or twisting.  How is this diagnosed? This condition may be diagnosed with a medical history, physical exam, and imaging tests. You may also have blood and urine tests to check for infection. How is this treated? Treatment may include:  Watching and waiting, particularly if the hydrocele causes no symptoms.  Treatment of the underlying condition. This may include using antibiotic medicine.  Surgery to drain the fluid. Some surgical options include: ? Needle aspiration. For this procedure, a needle is used to drain fluid. ? Hydrocelectomy. For this procedure, an incision is made in the scrotum to remove the fluid sac.  Follow these instructions at home:  Keep all follow-up visits as told by your health care provider. This is important.  Watch the hydrocele for any changes.  Take over-the-counter and prescription medicines only as told by your health care provider.  If you were prescribed an antibiotic medicine, use it as told by your health care provider. Do not stop using the antibiotic even if your condition improves. Contact a health care provider if:  The swelling in your scrotum or groin gets worse.  The hydrocele becomes red, firm, tender to the touch, or painful.  You notice any changes in the  hydrocele.  You have a fever. This information is not intended to replace advice given to you by your health care provider. Make sure you discuss any questions you have with your health care provider. Document Released: 08/31/2009 Document Revised: 08/19/2015 Document Reviewed: 03/09/2014 Elsevier Interactive Patient Education  2018 [employer]   HOME CARE INSTRUCTIONS FOR SCROTAL PROCEDURES  Wound Care & Hygiene: You may apply an ice bag to the scrotum for the first 24 hours.  This may help decrease swelling and soreness.  You may have a dressing held in place by an athletic supporter.  You may remove the dressing in 24 hours and shower in 48 hours.  Continue to use the athletic supporter or tight briefs for at least a week. Activity: Rest today - not necessarily flat bed rest.  Just take it easy.  You should not do strenuous activities until your follow-up visit with your doctor.  You may resume light activity in 48 hours.  Return to Work:  Your doctor will advise you of this depending on the type of work you do  Diet: Drink liquids or eat a light diet this evening.  You may resume a regular diet tomorrow.  General Expectations: You may have a small amount of bleeding.  The scrotum may be swollen or bruised for about a week.  Call your Doctor if these occur:  -persistent or heavy bleeding  -temperature of 101 degrees or more  -severe pain, not relieved by your pain medication  Return to [facility]:  Call to set up and  appointment.  Patient Signature:  __________________________________________________  Nurse's Signature:  __________________________________________________    Post Anesthesia Home Care Instructions  Activity: Get plenty of rest for the remainder of the day. A responsible individual must stay with you for 24 hours following the procedure.  For the next 24 hours, DO NOT: -Drive a car -Advertising copywriterperate machinery -Drink alcoholic beverages -Take any  medication unless instructed by your physician -Make any legal decisions or sign important papers.  Meals: Start with liquid foods such as gelatin or soup. Progress to regular foods as tolerated. Avoid greasy, spicy, heavy foods. If nausea and/or vomiting occur, drink only clear liquids until the nausea and/or vomiting subsides. Call your physician if vomiting continues.  Special Instructions/Symptoms: Your throat may feel dry or sore from the anesthesia or the breathing tube placed in your throat during surgery. If this causes discomfort, gargle with warm salt water. The discomfort should disappear within 24 hours.

## 2018-02-18 NOTE — Op Note (Signed)
Preoperative diagnosis: Right Hydrocele  Postoperative diagnosis: Same  Procedure: 1. Excision of right appendix testis 2. Right hydrocelectomy  Attending: Wilkie AyePatrick Bernabe Dorce, MD  Anesthesia: General  History of blood loss: Minimal  Antibiotics: ancef  Drains: none  Specimens: 1. Right hydrocele sac   Findings: 5cm  hydrocele  Indications: Patient is a 52 year old male with a history of right hydrocele that was growing in size and causing him pain with walking.  We discussed the treatment options including observation versus excision after discussing treatment options he proceed with excision.   Procedure in detail: Prior to procedure consent was obtained.  Patient was brought to the operating room and a brief timeout was done to ensure correct patient, correct procedure, correct site.  General anesthesia was administered and patient was placed in supine position.  His genitalia was then prepped and draped in usual sterile fashion.  A 3 cm incision was made in the right hemiscrotum.  We dissected down to the tunica and then incised the tunica. A large hydrocele was encountered and was drained. We then excised the hydrocele sac and then over sewed the edge with 2-0 Vicryl in a running fashion. We then excised the right appendix testis. Hemostasis was then obtained with electrocautery. We then closed the defect in the epididymis with 3-0 vicryl in a running fashion. We then returned the testis to the left hemiscrotum and closed the overlying dartos with 3-0 vicryl in a running fashion. The skin was then closed with 4-0 monocryl in a running fashion. Dermabond was placed on the incision.  A dressing was then applied to the incision.  We then placed a scrotal fluff and this then concluded the procedure which was well tolerated by the patient.  Complications: None  Condition: Stable, extubated, transferred to PACU.  Plan: Patient is to be discharged home.  He is to follow up in 2 weeks for  wound check.

## 2018-02-18 NOTE — Transfer of Care (Signed)
Last Vitals:  Vitals Value Taken Time  BP    Temp    Pulse 70 02/18/2018  1:07 PM  Resp 9 02/18/2018  1:07 PM  SpO2 100 % 02/18/2018  1:07 PM  Vitals shown include unvalidated device data.  Last Pain:  Vitals:   02/18/18 1024  TempSrc:   PainSc: 0-No pain      Patients Stated Pain Goal: 4 (02/18/18 1024)  Immediate Anesthesia Transfer of Care Note  Patient: Glen DickerAlfred Chiaramonte  Procedure(s) Performed: Procedure(s) (LRB): HYDROCELECTOMY ADULT (Right)  Patient Location: PACU  Anesthesia Type: General  Level of Consciousness: awake, alert  and oriented  Airway & Oxygen Therapy: Patient Spontanous Breathing and Patient connected to nasal cannula oxygen  Post-op Assessment: Report given to PACU RN and Post -op Vital signs reviewed and stable  Post vital signs: Reviewed and stable  Complications: No apparent anesthesia complications

## 2018-02-18 NOTE — Anesthesia Preprocedure Evaluation (Addendum)
Anesthesia Evaluation  Patient identified by MRN, date of birth, ID band Patient awake    Reviewed: Allergy & Precautions, NPO status , Patient's Chart, lab work & pertinent test results  Airway Mallampati: II  TM Distance: <3 FB Neck ROM: Full    Dental no notable dental hx. (+)    Pulmonary asthma ,    Pulmonary exam normal breath sounds clear to auscultation       Cardiovascular negative cardio ROS Normal cardiovascular exam Rhythm:Regular Rate:Normal     Neuro/Psych negative neurological ROS  negative psych ROS   GI/Hepatic negative GI ROS, Neg liver ROS,   Endo/Other  diabetes  Renal/GU negative Renal ROS  negative genitourinary   Musculoskeletal negative musculoskeletal ROS (+)   Abdominal   Peds negative pediatric ROS (+)  Hematology negative hematology ROS (+)   Anesthesia Other Findings   Reproductive/Obstetrics negative OB ROS                            Anesthesia Physical Anesthesia Plan  ASA: II  Anesthesia Plan: General   Post-op Pain Management:    Induction: Intravenous  PONV Risk Score and Plan: 2 and Ondansetron, Dexamethasone and Treatment may vary due to age or medical condition  Airway Management Planned: LMA  Additional Equipment:   Intra-op Plan:   Post-operative Plan: Extubation in OR  Informed Consent: I have reviewed the patients History and Physical, chart, labs and discussed the procedure including the risks, benefits and alternatives for the proposed anesthesia with the patient or authorized representative who has indicated his/her understanding and acceptance.   Dental advisory given  Plan Discussed with: CRNA and Surgeon  Anesthesia Plan Comments:         Anesthesia Quick Evaluation

## 2018-02-18 NOTE — Anesthesia Procedure Notes (Signed)
Procedure Name: LMA Insertion Date/Time: 02/18/2018 12:06 PM Performed by: Eilene Ghaziose, George, MD Pre-anesthesia Checklist: Patient identified, Emergency Drugs available, Suction available and Patient being monitored Patient Re-evaluated:Patient Re-evaluated prior to induction Oxygen Delivery Method: Circle system utilized Preoxygenation: Pre-oxygenation with 100% oxygen Induction Type: IV induction Ventilation: Mask ventilation without difficulty LMA: LMA inserted LMA Size: 4.0 Number of attempts: 1 Airway Equipment and Method: Bite block Placement Confirmation: positive ETCO2 Tube secured with: Tape Dental Injury: Teeth and Oropharynx as per pre-operative assessment

## 2018-02-19 ENCOUNTER — Encounter (HOSPITAL_BASED_OUTPATIENT_CLINIC_OR_DEPARTMENT_OTHER): Payer: Self-pay | Admitting: Urology

## 2018-02-25 DIAGNOSIS — E785 Hyperlipidemia, unspecified: Secondary | ICD-10-CM | POA: Diagnosis not present

## 2018-02-25 DIAGNOSIS — B353 Tinea pedis: Secondary | ICD-10-CM | POA: Diagnosis not present

## 2018-02-25 DIAGNOSIS — E1165 Type 2 diabetes mellitus with hyperglycemia: Secondary | ICD-10-CM | POA: Diagnosis not present

## 2018-02-26 DIAGNOSIS — J301 Allergic rhinitis due to pollen: Secondary | ICD-10-CM | POA: Diagnosis not present

## 2018-03-04 DIAGNOSIS — N430 Encysted hydrocele: Secondary | ICD-10-CM | POA: Diagnosis not present

## 2018-03-25 DIAGNOSIS — J301 Allergic rhinitis due to pollen: Secondary | ICD-10-CM | POA: Diagnosis not present

## 2018-04-15 DIAGNOSIS — J301 Allergic rhinitis due to pollen: Secondary | ICD-10-CM | POA: Diagnosis not present

## 2018-04-29 DIAGNOSIS — J301 Allergic rhinitis due to pollen: Secondary | ICD-10-CM | POA: Diagnosis not present

## 2018-05-13 DIAGNOSIS — J301 Allergic rhinitis due to pollen: Secondary | ICD-10-CM | POA: Diagnosis not present

## 2018-06-10 DIAGNOSIS — J301 Allergic rhinitis due to pollen: Secondary | ICD-10-CM | POA: Diagnosis not present

## 2018-07-02 DIAGNOSIS — J301 Allergic rhinitis due to pollen: Secondary | ICD-10-CM | POA: Diagnosis not present

## 2018-07-17 DIAGNOSIS — J301 Allergic rhinitis due to pollen: Secondary | ICD-10-CM | POA: Diagnosis not present

## 2018-08-05 DIAGNOSIS — J301 Allergic rhinitis due to pollen: Secondary | ICD-10-CM | POA: Diagnosis not present

## 2018-08-11 DIAGNOSIS — J301 Allergic rhinitis due to pollen: Secondary | ICD-10-CM | POA: Diagnosis not present

## 2018-08-20 DIAGNOSIS — J301 Allergic rhinitis due to pollen: Secondary | ICD-10-CM | POA: Diagnosis not present

## 2019-04-29 ENCOUNTER — Other Ambulatory Visit: Payer: Self-pay

## 2019-05-09 ENCOUNTER — Other Ambulatory Visit: Payer: Self-pay | Admitting: Hematology

## 2019-05-09 DIAGNOSIS — D72819 Decreased white blood cell count, unspecified: Secondary | ICD-10-CM | POA: Insufficient documentation

## 2019-05-09 DIAGNOSIS — D696 Thrombocytopenia, unspecified: Secondary | ICD-10-CM | POA: Insufficient documentation

## 2019-05-09 NOTE — Progress Notes (Signed)
[name] CONSULT NOTE  Patient Care Team: Benito Mccreedy, MD as PCP - General (Internal Medicine)  HEME/ONC OVERVIEW: 1. Leukopenia and thrombocytopenia -WBC between 2-3k w/ ANC > 1000 and plts between 100 and 120k's since 2018   ASSESSMENT & PLAN:   Leukopenia -I reviewed the patient's records in detail, including external PCP clinic notes and lab studies -In summary, patient had chronic mild leukopenia dating back to at least 2018, with WBC between 2-3k and ANC > 1000.  No prior work-up has been done in the past. -Clinically, patient denies any history of severe or frequent infections, medication changes, or personal history of malignancy requiring radiation or chemotherapy treatment -I personally reviewed the patient's peripheral blood smear today.  The red blood cells were of normal morphology.  There was no schistocytosis.  The white blood cells were of normal morphology. There were no peripheral circulating blasts. The platelets were of normal size and I verified that there were no platelet clumping. -I have ordered infectious studies, including HIV, Hep B/C serologies, nutritional studies, including copper, and peripheral blood flow cytometry  -If the nutritional and infectious studies, as well as abdominal ultrasound (see below), do not reveal the etiology of pancytopenia, then we can consider bone marrow biopsy to rule out any underlying bone marrow disorder -We will see the patient back in 2 months for labs, and if pancytopenia persists, then we will discussed the role of bone marrow biopsy at that time  Thrombocytopenia -Platelets between 100 and 120k since at least 2018 -Platelets 109k today,s table  -Peripheral blood smear results as above -In addition to the infectious and nutritional studies above, I have also ordered PT and PTT to rule out coagulopathy -Furthermore, as thrombocytopenia can be associated with liver disease, I have ordered abdominal  ultrasound to rule out liver pathology  -I counseled the patient that if he develops any abnormal bleeding, such as hematochezia, melena, or hematuria, or excessive bruising, he should seek care promptly  Normocytic anemia -Hemoglobin fluctuates between mid-12's and low normal -Hgb 12.6 today, stable -Patient denies any symptoms of bleeding.  No prior history of colonoscopy -Given the patient's age and mild intermittent anemia, I discussed with the patient the rationale for screening colonoscopy -Patient is concerned about the cost of the procedure, and I recommended the patient to discuss with the gastroenterology regarding the benefits and risks, as well as possible procedure -I have placed referral to gastroenterology  Orders Placed This Encounter  Procedures  . CBC with Differential (Cancer Center Only)    Standing Status:   Future    Standing Expiration Date:   06/15/2020  . CMP ([facility] only)    Standing Status:   Future    Standing Expiration Date:   06/15/2020  . Save Smear (SS[facility])    Standing Status:   Future    Standing Expiration Date:   05/11/2020  . Ferritin    Standing Status:   Future    Standing Expiration Date:   06/15/2020  . Iron and TIBC    Standing Status:   Future    Standing Expiration Date:   06/15/2020    The total time spent in the encounter was 55 minutes, including face-to-face time with the patient, review of various tests results, order additional studies/medications, documentation, and coordination of care plan.   All questions were answered. The patient knows to call the clinic with any problems, questions or concerns. No barriers to learning was detected.  Return in  2 months for labs and clinic follow-up.  Tish Men, MD 2/15/20219:42 AM  CHIEF COMPLAINTS/PURPOSE OF CONSULTATION:  "I don't know why I am here"  HISTORY OF PRESENTING ILLNESS:  G[place] Clark 54 y.o. male is here because of chronic, mild pancytopenia.  Patient had chronic mild  leukopenia dating back to at least 2018, with WBC between 2-3k and ANC > 1000.  No prior work-up has been done in the past.  He reports that he works in [employer], and denies any history of tobacco, alcohol, or illicit drug use.  He has mild intermittent right-sided flank pain, exacerbated by activity, resolves with rest, for which his PCP had prescribed a muscle relaxant with modest improvement.  He denies any constitutional symptoms.  There is no family history of malignancy or blood disorder.  He is from [place].  He denies any other complaint today.  REVIEW OF SYSTEMS:   Constitutional: ( - ) fevers, ( - )  chills , ( - ) night sweats Eyes: ( - ) blurriness of vision, ( - ) double vision, ( - ) watery eyes Ears, nose, mouth, throat, and face: ( - ) mucositis, ( - ) sore throat Respiratory: ( - ) cough, ( - ) dyspnea, ( - ) wheezes Cardiovascular: ( - ) palpitation, ( - ) chest discomfort, ( - ) lower extremity swelling Gastrointestinal:  ( - ) nausea, ( - ) heartburn, ( - ) change in bowel habits Skin: ( - ) abnormal skin rashes Lymphatics: ( - ) new lymphadenopathy, ( - ) easy bruising Neurological: ( - ) numbness, ( - ) tingling, ( - ) new weaknesses Behavioral/Psych: ( - ) mood change, ( - ) new changes  All other systems were reviewed with the patient and are negative.  I have reviewed his chart and materials related to his cancer extensively and collaborated history with the patient. Summary of oncologic history is as follows: Oncology History   No history exists.    MEDICAL HISTORY:  Past Medical History:  Diagnosis Date  . Asthma   . Diabetes mellitus without complication ([place])   . History of bronchitis 06/2015  . History of hemorrhoids   . History of seasonal allergies   . Hyperlipidemia   . Right hydrocele     SURGICAL HISTORY: Past Surgical History:  Procedure Laterality Date  . APPENDECTOMY    . HYDROCELE EXCISION Right 02/18/2018   Procedure:  HYDROCELECTOMY ADULT;  Surgeon: Cleon Gustin, MD;  [facility];  Service: Urology;  Laterality: Right;    SOCIAL HISTORY: Social History   Socioeconomic History  . Marital status: Married    Spouse name: Not on file  . Number of children: Not on file  . Years of education: Not on file  . Highest education level: Not on file  Occupational History  . Not on file  Tobacco Use  . Smoking status: Never Smoker  . Smokeless tobacco: Never Used  Substance and Sexual Activity  . Alcohol use: Yes    Comment: occ  . Drug use: No  . Sexual activity: Not on file  Other Topics Concern  . Not on file  Social History Narrative  . Not on file   Social Determinants of Health   Financial Resource Strain:   . Difficulty of Paying Living Expenses: Not on file  Food Insecurity:   . Worried About Charity fundraiser in the Last Year: Not on file  . Ran Out of Food  in the Last Year: Not on file  Transportation Needs:   . Lack of Transportation (Medical): Not on file  . Lack of Transportation (Non-Medical): Not on file  Physical Activity:   . Days of Exercise per Week: Not on file  . Minutes of Exercise per Session: Not on file  Stress:   . Feeling of Stress : Not on file  Social Connections:   . Frequency of Communication with Friends and Family: Not on file  . Frequency of Social Gatherings with Friends and Family: Not on file  . Attends Religious Services: Not on file  . Active Member of Clubs or Organizations: Not on file  . Attends Archivist Meetings: Not on file  . Marital Status: Not on file  Intimate Partner Violence:   . Fear of Current or Ex-Partner: Not on file  . Emotionally Abused: Not on file  . Physically Abused: Not on file  . Sexually Abused: Not on file    FAMILY HISTORY: Family History  Problem Relation Age of Onset  . Amblyopia Neg Hx   . Blindness Neg Hx   . Cataracts Neg Hx   . Glaucoma Neg Hx   . Macular  degeneration Neg Hx   . Retinal detachment Neg Hx   . Strabismus Neg Hx   . Retinitis pigmentosa Neg Hx     ALLERGIES:  is allergic to aspirin.  MEDICATIONS:  Current Outpatient Medications  Medication Sig Dispense Refill  . albuterol (PROVENTIL HFA;VENTOLIN HFA) 108 (90 Base) MCG/ACT inhaler Inhale into the lungs every 6 (six) hours as needed.     Marland Kitchen atorvastatin (LIPITOR) 10 MG tablet Take 10 mg by mouth every evening.   3  . budesonide (PULMICORT FLEXHALER) 180 MCG/ACT inhaler Inhale 2 puffs into the lungs daily.    . cetirizine (ZYRTEC) 10 MG tablet Take 10 mg by mouth every evening.    . loratadine (CLARITIN) 10 MG tablet Take 1 tablet (10 mg total) by mouth daily. (Patient taking differently: Take 10 mg by mouth every evening. ) 30 tablet 1  . metFORMIN (GLUCOPHAGE) 500 MG tablet every evening.   1  . montelukast (SINGULAIR) 10 MG tablet Take 10 mg by mouth at bedtime.     No current facility-administered medications for this visit.    PHYSICAL EXAMINATION: ECOG PERFORMANCE STATUS: 0 - Asymptomatic  Vitals:   05/12/19 0908  BP: 125/74  Pulse: 61  Resp: 18  Temp: (!) 97.5 F (36.4 C)  SpO2: 100%   Filed Weights   05/12/19 0908  Weight: 192 lb 12.8 oz (87.5 kg)    GENERAL: alert, no distress and comfortable SKIN: skin color, texture, turgor are normal, no rashes or significant lesions EYES: conjunctiva are pink and non-injected, sclera clear OROPHARYNX: no exudate, no erythema; lips, buccal mucosa, and tongue normal  NECK: supple, non-tender LYMPH:  no palpable lymphadenopathy in the cervical LUNGS: clear to auscultation with normal breathing effort HEART: regular rate & rhythm, no murmurs, no lower extremity edema ABDOMEN: soft, non-tender, non-distended, normal bowel sounds Musculoskeletal: no cyanosis of digits and no clubbing  PSYCH: alert & oriented x 3, fluent speech  LABORATORY DATA:  I have reviewed the data as listed Lab Results  Component Value Date    WBC 2.8 (L) 05/12/2019   HGB 12.6 (L) 05/12/2019   HCT 37.4 (L) 05/12/2019   MCV 93.5 05/12/2019   PLT 109 (L) 05/12/2019   Lab Results  Component Value Date   NA 142 05/12/2019  K 3.6 05/12/2019   CL 109 05/12/2019   CO2 27 05/12/2019    RADIOGRAPHIC STUDIES: I have personally reviewed the radiological images as listed and agreed with the findings in the report. No results found.  PATHOLOGY: I have reviewed the pathology reports as documented in the oncologist history.

## 2019-05-12 ENCOUNTER — Inpatient Hospital Stay: Payer: Managed Care, Other (non HMO) | Attending: Hematology

## 2019-05-12 ENCOUNTER — Encounter: Payer: Self-pay | Admitting: Hematology

## 2019-05-12 ENCOUNTER — Other Ambulatory Visit: Payer: Self-pay

## 2019-05-12 ENCOUNTER — Inpatient Hospital Stay (HOSPITAL_BASED_OUTPATIENT_CLINIC_OR_DEPARTMENT_OTHER): Payer: Managed Care, Other (non HMO) | Admitting: Hematology

## 2019-05-12 DIAGNOSIS — D72819 Decreased white blood cell count, unspecified: Secondary | ICD-10-CM

## 2019-05-12 DIAGNOSIS — D696 Thrombocytopenia, unspecified: Secondary | ICD-10-CM

## 2019-05-12 DIAGNOSIS — E785 Hyperlipidemia, unspecified: Secondary | ICD-10-CM | POA: Diagnosis not present

## 2019-05-12 DIAGNOSIS — Z79899 Other long term (current) drug therapy: Secondary | ICD-10-CM | POA: Diagnosis not present

## 2019-05-12 DIAGNOSIS — D649 Anemia, unspecified: Secondary | ICD-10-CM

## 2019-05-12 DIAGNOSIS — Z7951 Long term (current) use of inhaled steroids: Secondary | ICD-10-CM

## 2019-05-12 DIAGNOSIS — E119 Type 2 diabetes mellitus without complications: Secondary | ICD-10-CM | POA: Diagnosis not present

## 2019-05-12 DIAGNOSIS — J45909 Unspecified asthma, uncomplicated: Secondary | ICD-10-CM

## 2019-05-12 DIAGNOSIS — Z7984 Long term (current) use of oral hypoglycemic drugs: Secondary | ICD-10-CM

## 2019-05-12 DIAGNOSIS — D61818 Other pancytopenia: Secondary | ICD-10-CM | POA: Insufficient documentation

## 2019-05-12 DIAGNOSIS — R109 Unspecified abdominal pain: Secondary | ICD-10-CM | POA: Insufficient documentation

## 2019-05-12 LAB — HEPATITIS B SURFACE ANTIBODY,QUALITATIVE: Hep B S Ab: NONREACTIVE

## 2019-05-12 LAB — SAVE SMEAR(SSMR), FOR PROVIDER SLIDE REVIEW

## 2019-05-12 LAB — CBC WITH DIFFERENTIAL (CANCER CENTER ONLY)
Abs Immature Granulocytes: 0.01 10*3/uL (ref 0.00–0.07)
Basophils Absolute: 0 10*3/uL (ref 0.0–0.1)
Basophils Relative: 0 %
Eosinophils Absolute: 0 10*3/uL (ref 0.0–0.5)
Eosinophils Relative: 0 %
HCT: 37.4 % — ABNORMAL LOW (ref 39.0–52.0)
Hemoglobin: 12.6 g/dL — ABNORMAL LOW (ref 13.0–17.0)
Immature Granulocytes: 0 %
Lymphocytes Relative: 52 %
Lymphs Abs: 1.5 10*3/uL (ref 0.7–4.0)
MCH: 31.5 pg (ref 26.0–34.0)
MCHC: 33.7 g/dL (ref 30.0–36.0)
MCV: 93.5 fL (ref 80.0–100.0)
Monocytes Absolute: 0.3 10*3/uL (ref 0.1–1.0)
Monocytes Relative: 12 %
Neutro Abs: 1 10*3/uL — ABNORMAL LOW (ref 1.7–7.7)
Neutrophils Relative %: 36 %
Platelet Count: 109 10*3/uL — ABNORMAL LOW (ref 150–400)
RBC: 4 MIL/uL — ABNORMAL LOW (ref 4.22–5.81)
RDW: 12 % (ref 11.5–15.5)
WBC Count: 2.8 10*3/uL — ABNORMAL LOW (ref 4.0–10.5)
nRBC: 0 % (ref 0.0–0.2)

## 2019-05-12 LAB — LACTATE DEHYDROGENASE: LDH: 135 U/L (ref 98–192)

## 2019-05-12 LAB — CMP (CANCER CENTER ONLY)
ALT: 23 U/L (ref 0–44)
AST: 16 U/L (ref 15–41)
Albumin: 4.2 g/dL (ref 3.5–5.0)
Alkaline Phosphatase: 56 U/L (ref 38–126)
Anion gap: 6 (ref 5–15)
BUN: 18 mg/dL (ref 6–20)
CO2: 27 mmol/L (ref 22–32)
Calcium: 9.1 mg/dL (ref 8.9–10.3)
Chloride: 109 mmol/L (ref 98–111)
Creatinine: 1.01 mg/dL (ref 0.61–1.24)
GFR, Est AFR Am: >60 mL/min (ref >60)
GFR, Estimated: >60 mL/min (ref >60)
Glucose, Bld: 111 mg/dL — ABNORMAL HIGH (ref 70–99)
Potassium: 3.6 mmol/L (ref 3.5–5.1)
Sodium: 142 mmol/L (ref 135–145)
Total Bilirubin: 0.4 mg/dL (ref 0.3–1.2)
Total Protein: 6.3 g/dL — ABNORMAL LOW (ref 6.5–8.1)

## 2019-05-12 LAB — PROTIME-INR
INR: 1 (ref 0.8–1.2)
Prothrombin Time: 13.3 seconds (ref 11.4–15.2)

## 2019-05-12 LAB — HIV ANTIBODY (ROUTINE TESTING W REFLEX): HIV Screen 4th Generation wRfx: NONREACTIVE

## 2019-05-12 LAB — HEPATITIS B SURFACE ANTIGEN: Hepatitis B Surface Ag: NONREACTIVE

## 2019-05-12 LAB — APTT: aPTT: 27 seconds (ref 24–36)

## 2019-05-12 LAB — HEPATITIS B CORE ANTIBODY, TOTAL: Hep B Core Total Ab: NONREACTIVE

## 2019-05-13 ENCOUNTER — Inpatient Hospital Stay: Payer: Managed Care, Other (non HMO)

## 2019-05-13 ENCOUNTER — Ambulatory Visit: Payer: Managed Care, Other (non HMO) | Admitting: Family

## 2019-05-14 LAB — COPPER, SERUM: Copper: 108 ug/dL (ref 69–132)

## 2019-06-03 ENCOUNTER — Other Ambulatory Visit: Payer: Self-pay | Admitting: Urology

## 2019-06-03 DIAGNOSIS — N200 Calculus of kidney: Secondary | ICD-10-CM

## 2019-06-19 ENCOUNTER — Other Ambulatory Visit (HOSPITAL_COMMUNITY)
Admission: RE | Admit: 2019-06-19 | Discharge: 2019-06-19 | Disposition: A | Discharge status: Home / Self Care | Payer: Managed Care, Other (non HMO) | Source: Ambulatory Visit | Attending: Urology | Admitting: Urology

## 2019-06-19 DIAGNOSIS — Z01812 Encounter for preprocedural laboratory examination: Secondary | ICD-10-CM | POA: Insufficient documentation

## 2019-06-19 DIAGNOSIS — Z20822 Contact with and (suspected) exposure to covid-19: Secondary | ICD-10-CM | POA: Insufficient documentation

## 2019-06-19 LAB — SARS CORONAVIRUS 2 (TAT 6-24 HRS): SARS Coronavirus 2: NEGATIVE

## 2019-06-20 NOTE — Progress Notes (Signed)
Left voice mail to follow instructions in packet except to be NPO after MN. Time to be at surgery [facility]  [phone] and to have someone to drive him here and to stay with him for the first 24 hrs after procedure

## 2019-06-20 NOTE — Progress Notes (Signed)
Tried to call pt. Left voice mail to call back concerning instruction for procedure on Monday

## 2019-06-23 ENCOUNTER — Ambulatory Visit (HOSPITAL_COMMUNITY): Payer: Managed Care, Other (non HMO)

## 2019-06-23 ENCOUNTER — Encounter (HOSPITAL_BASED_OUTPATIENT_CLINIC_OR_DEPARTMENT_OTHER)
Admission: RE | Disposition: A | Discharge status: Home / Self Care | Payer: Self-pay | Source: Ambulatory Visit | Attending: Urology

## 2019-06-23 ENCOUNTER — Ambulatory Visit (HOSPITAL_BASED_OUTPATIENT_CLINIC_OR_DEPARTMENT_OTHER)
Admission: RE | Admit: 2019-06-23 | Discharge: 2019-06-23 | Disposition: A | Discharge status: Home / Self Care | Payer: Managed Care, Other (non HMO) | Source: Ambulatory Visit | Attending: Urology | Admitting: Urology

## 2019-06-23 ENCOUNTER — Encounter (HOSPITAL_BASED_OUTPATIENT_CLINIC_OR_DEPARTMENT_OTHER): Payer: Self-pay | Admitting: Urology

## 2019-06-23 DIAGNOSIS — E119 Type 2 diabetes mellitus without complications: Secondary | ICD-10-CM | POA: Diagnosis not present

## 2019-06-23 DIAGNOSIS — N200 Calculus of kidney: Secondary | ICD-10-CM | POA: Diagnosis not present

## 2019-06-23 DIAGNOSIS — Z886 Allergy status to analgesic agent status: Secondary | ICD-10-CM | POA: Diagnosis not present

## 2019-06-23 DIAGNOSIS — J45909 Unspecified asthma, uncomplicated: Secondary | ICD-10-CM | POA: Insufficient documentation

## 2019-06-23 DIAGNOSIS — E785 Hyperlipidemia, unspecified: Secondary | ICD-10-CM | POA: Diagnosis not present

## 2019-06-23 DIAGNOSIS — Z7984 Long term (current) use of oral hypoglycemic drugs: Secondary | ICD-10-CM | POA: Diagnosis not present

## 2019-06-23 HISTORY — PX: EXTRACORPOREAL SHOCK WAVE LITHOTRIPSY: SHX1557

## 2019-06-23 LAB — GLUCOSE, CAPILLARY: Glucose-Capillary: 87 mg/dL (ref 70–99)

## 2019-06-23 SURGERY — LITHOTRIPSY, ESWL
Anesthesia: LOCAL | Laterality: Right

## 2019-06-23 MED ORDER — DIAZEPAM 5 MG PO TABS
10.0000 mg | ORAL_TABLET | ORAL | Status: AC
  Administered 2019-06-23: 10 mg via ORAL
  Filled 2019-06-23: qty 2

## 2019-06-23 MED ORDER — SODIUM CHLORIDE 0.9 % IV SOLN
INTRAVENOUS | Status: DC
  Filled 2019-06-23: qty 1000

## 2019-06-23 MED ORDER — CIPROFLOXACIN HCL 500 MG PO TABS
500.0000 mg | ORAL_TABLET | Freq: Once | ORAL | Status: AC
  Administered 2019-06-23: 500 mg via ORAL
  Filled 2019-06-23: qty 1

## 2019-06-23 MED ORDER — HYDROCODONE-ACETAMINOPHEN 5-325 MG PO TABS: 1.0000 | ORAL_TABLET | ORAL | 0 refills | Status: DC | PRN

## 2019-06-23 MED ORDER — DIAZEPAM 5 MG PO TABS
ORAL_TABLET | ORAL | Status: AC
  Filled 2019-06-23: qty 2

## 2019-06-23 MED ORDER — DIPHENHYDRAMINE HCL 25 MG PO CAPS
ORAL_CAPSULE | ORAL | Status: AC
  Filled 2019-06-23: qty 1

## 2019-06-23 MED ORDER — CIPROFLOXACIN HCL 500 MG PO TABS
ORAL_TABLET | ORAL | Status: AC
  Filled 2019-06-23: qty 1

## 2019-06-23 MED ORDER — DIPHENHYDRAMINE HCL 25 MG PO CAPS
25.0000 mg | ORAL_CAPSULE | ORAL | Status: AC
  Administered 2019-06-23: 25 mg via ORAL
  Filled 2019-06-23: qty 1

## 2019-06-23 NOTE — Op Note (Signed)
See [facility] OP note scanned into chart. Also because of the size, density, location and other factors that cannot be anticipated I feel this will likely be a staged procedure. This fact supersedes any indication in the scanned [facility] operative note to the contrary.  

## 2019-06-23 NOTE — H&P (Signed)
See scanned H&P

## 2019-06-23 NOTE — Discharge Instructions (Signed)
Lithotripsy, Care After This sheet gives you information about how to care for yourself after your procedure. Your health care provider may also give you more specific instructions. If you have problems or questions, contact your health care provider. What can I expect after the procedure? After the procedure, it is common to have:  Some blood in your urine. This should only last for a few days.  Soreness in your back, sides, or upper abdomen for a few days.  Blotches or bruises on your back where the pressure wave entered the skin.  Pain, discomfort, or nausea when pieces (fragments) of the kidney stone move through the tube that carries urine from the kidney to the bladder (ureter). Stone fragments may pass soon after the procedure, but they may continue to pass for up to 4-8 weeks. ? If you have severe pain or nausea, contact your health care provider. This may be caused by a large stone that was not broken up, and this may mean that you need more treatment.  Some pain or discomfort during urination.  Some pain or discomfort in the lower abdomen or (in men) at the base of the penis. Follow these instructions at home: Medicines  Take over-the-counter and prescription medicines only as told by your health care provider.  If you were prescribed an antibiotic medicine, take it as told by your health care provider. Do not stop taking the antibiotic even if you start to feel better.  Do not drive for 24 hours if you were given a medicine to help you relax (sedative).  Do not drive or use heavy machinery while taking prescription pain medicine. Eating and drinking      Drink enough water and fluids to keep your urine clear or pale yellow. This helps any remaining pieces of the stone to pass. It can also help prevent new stones from forming.  Eat plenty of fresh fruits and vegetables.  Follow instructions from your health care provider about eating and drinking restrictions. You may be  instructed: ? To reduce how much salt (sodium) you eat or drink. Check ingredients and nutrition facts on packaged foods and beverages. ? To reduce how much meat you eat.  Eat the recommended amount of calcium for your age and gender. Ask your health care provider how much calcium you should have. General instructions  Get plenty of rest.  Most people can resume normal activities 1-2 days after the procedure. Ask your health care provider what activities are safe for you.  Your health care provider may direct you to lie in a certain position (postural drainage) and tap firmly (percuss) over your kidney area to help stone fragments pass. Follow instructions as told by your health care provider.  If directed, strain all urine through the strainer that was provided by your health care provider. ? Keep all fragments for your health care provider to see. Any stones that are found may be sent to a medical lab for examination. The stone may be as small as a grain of salt.  Keep all follow-up visits as told by your health care provider. This is important. Contact a health care provider if:  You have pain that is severe or does not get better with medicine.  You have nausea that is severe or does not go away.  You have blood in your urine longer than your health care provider told you to expect.  You have more blood in your urine.  You have pain during urination that does   not go away.  You urinate more frequently than usual and this does not go away.  You develop a rash or any other possible signs of an allergic reaction. Get help right away if:  You have severe pain in your back, sides, or upper abdomen.  You have severe pain while urinating.  Your urine is very dark red.  You have blood in your stool (feces).  You cannot pass any urine at all.  You feel a strong urge to urinate after emptying your bladder.  You have a fever or chills.  You develop shortness of breath,  difficulty breathing, or chest pain.  You have severe nausea that leads to persistent vomiting.  You faint. Summary  After this procedure, it is common to have some pain, discomfort, or nausea when pieces (fragments) of the kidney stone move through the tube that carries urine from the kidney to the bladder (ureter). If this pain or nausea is severe, however, you should contact your health care provider.  Most people can resume normal activities 1-2 days after the procedure. Ask your health care provider what activities are safe for you.  Drink enough water and fluids to keep your urine clear or pale yellow. This helps any remaining pieces of the stone to pass, and it can help prevent new stones from forming.  If directed, strain your urine and keep all fragments for your health care provider to see. Fragments or stones may be as small as a grain of salt.  Get help right away if you have severe pain in your back, sides, or upper abdomen or have severe pain while urinating. This information is not intended to replace advice given to you by your health care provider. Make sure you discuss any questions you have with your health care provider. Document Revised: 06/24/2018 Document Reviewed: 02/02/2016 [employer] Patient Education  2020 [employer]  

## 2019-07-14 ENCOUNTER — Encounter: Payer: Self-pay | Admitting: Hematology

## 2019-07-14 ENCOUNTER — Telehealth: Payer: Self-pay | Admitting: Hematology

## 2019-07-14 ENCOUNTER — Encounter: Payer: Self-pay | Admitting: Gastroenterology

## 2019-07-14 ENCOUNTER — Inpatient Hospital Stay (HOSPITAL_BASED_OUTPATIENT_CLINIC_OR_DEPARTMENT_OTHER): Payer: Managed Care, Other (non HMO) | Admitting: Hematology

## 2019-07-14 ENCOUNTER — Other Ambulatory Visit: Payer: Self-pay

## 2019-07-14 ENCOUNTER — Inpatient Hospital Stay: Payer: Managed Care, Other (non HMO) | Attending: Hematology

## 2019-07-14 VITALS — BP 124/68 | HR 64 | Temp 97.5°F | Resp 18 | Ht 67.0 in | Wt 193.4 lb

## 2019-07-14 DIAGNOSIS — Z7951 Long term (current) use of inhaled steroids: Secondary | ICD-10-CM | POA: Insufficient documentation

## 2019-07-14 DIAGNOSIS — Z7984 Long term (current) use of oral hypoglycemic drugs: Secondary | ICD-10-CM | POA: Diagnosis not present

## 2019-07-14 DIAGNOSIS — Z79899 Other long term (current) drug therapy: Secondary | ICD-10-CM | POA: Insufficient documentation

## 2019-07-14 DIAGNOSIS — D61818 Other pancytopenia: Secondary | ICD-10-CM

## 2019-07-14 DIAGNOSIS — D649 Anemia, unspecified: Secondary | ICD-10-CM

## 2019-07-14 DIAGNOSIS — D72819 Decreased white blood cell count, unspecified: Secondary | ICD-10-CM

## 2019-07-14 DIAGNOSIS — D696 Thrombocytopenia, unspecified: Secondary | ICD-10-CM

## 2019-07-14 LAB — CBC WITH DIFFERENTIAL (CANCER CENTER ONLY)
Abs Immature Granulocytes: 0.01 10*3/uL (ref 0.00–0.07)
Basophils Absolute: 0 10*3/uL (ref 0.0–0.1)
Basophils Relative: 1 %
Eosinophils Absolute: 0.1 10*3/uL (ref 0.0–0.5)
Eosinophils Relative: 2 %
HCT: 37.9 % — ABNORMAL LOW (ref 39.0–52.0)
Hemoglobin: 12.4 g/dL — ABNORMAL LOW (ref 13.0–17.0)
Immature Granulocytes: 0 %
Lymphocytes Relative: 42 %
Lymphs Abs: 1.5 10*3/uL (ref 0.7–4.0)
MCH: 31.1 pg (ref 26.0–34.0)
MCHC: 32.7 g/dL (ref 30.0–36.0)
MCV: 95 fL (ref 80.0–100.0)
Monocytes Absolute: 0.4 10*3/uL (ref 0.1–1.0)
Monocytes Relative: 13 %
Neutro Abs: 1.5 10*3/uL — ABNORMAL LOW (ref 1.7–7.7)
Neutrophils Relative %: 42 %
Platelet Count: 98 10*3/uL — ABNORMAL LOW (ref 150–400)
RBC: 3.99 MIL/uL — ABNORMAL LOW (ref 4.22–5.81)
RDW: 12.6 % (ref 11.5–15.5)
WBC Count: 3.4 10*3/uL — ABNORMAL LOW (ref 4.0–10.5)
nRBC: 0 % (ref 0.0–0.2)

## 2019-07-14 LAB — CMP (CANCER CENTER ONLY)
ALT: 21 U/L (ref 0–44)
AST: 17 U/L (ref 15–41)
Albumin: 4.2 g/dL (ref 3.5–5.0)
Alkaline Phosphatase: 58 U/L (ref 38–126)
Anion gap: 8 (ref 5–15)
BUN: 19 mg/dL (ref 6–20)
CO2: 28 mmol/L (ref 22–32)
Calcium: 9.1 mg/dL (ref 8.9–10.3)
Chloride: 107 mmol/L (ref 98–111)
Creatinine: 1.05 mg/dL (ref 0.61–1.24)
GFR, Est AFR Am: >60 mL/min (ref >60)
GFR, Estimated: >60 mL/min (ref >60)
Glucose, Bld: 128 mg/dL — ABNORMAL HIGH (ref 70–99)
Potassium: 3.6 mmol/L (ref 3.5–5.1)
Sodium: 143 mmol/L (ref 135–145)
Total Bilirubin: 0.4 mg/dL (ref 0.3–1.2)
Total Protein: 6.7 g/dL (ref 6.5–8.1)

## 2019-07-14 LAB — SAVE SMEAR(SSMR), FOR PROVIDER SLIDE REVIEW

## 2019-07-14 LAB — IRON AND TIBC
Iron: 67 ug/dL (ref 42–163)
Saturation Ratios: 25 % (ref 20–55)
TIBC: 264 ug/dL (ref 202–409)
UIBC: 197 ug/dL (ref 117–376)

## 2019-07-14 LAB — FERRITIN: Ferritin: 124 ng/mL (ref 24–336)

## 2019-07-14 NOTE — Telephone Encounter (Signed)
Appointments scheduled calendar printed per 4/19 los 

## 2019-07-14 NOTE — Progress Notes (Signed)
[facility] OFFICE PROGRESS NOTE  Patient Care Team: Benito Mccreedy, MD as PCP - General (Internal Medicine)  HEME/ONC OVERVIEW: 1. Pancytopenia  -WBC between 2-3k w/ ANC > 1000 and plts between 100 and 120k's since 2018; Hgb ~mid-12's   Nutritional, infectious and coagulation studies unremarkable   ASSESSMENT & PLAN:   Pancytopenia  -Etiology unclear; ddx includes liver disease, autoimmune disorder, medications, and bone marrow disorder, such as MDS -I reviewed the recent nutritional and infectious studies with the patient -Labs reviewed and pancytopenia remain overall very stable -Clinically, patient denies any constitutional or other suspicious symptoms -I have ordered abdominal US to rule out any liver disease -In addition, I have referred the patient to gastroenterology for colon cancer screening, given his age and anemia -As his pancytopenia remains relatively mild and has been stable since at least 2018, there is no urgent indication to pursue bone marrow biopsy at this time -However, if he develops any progressive cytopenias or clinically suspicious symptoms, then bone marrow biopsy would be warranted -I discussed some of the concerning symptoms, including but not limited to, unexplained persistent fever (T-max > 100.4), drenching night sweats, unexplained weight loss, progressive lymphadenopathy, chest pain, dyspnea, recurrent infections, requiring frequent antibiotics, unexplained bleeding or excessive bruising, for which he is instructed to contact the clinic for further evaluation -Finally, given the patient's chronic allergy requiring weekly allergy shots x 2 years, I have ordered ANA to screen for any autoimmune disorder at the next visit   Orders Placed This Encounter  Procedures  . US Abdomen Complete    Standing Status:   Future    Standing Expiration Date:   07/13/2020    Order Specific Question:   Reason for Exam (SYMPTOM  OR DIAGNOSIS REQUIRED)     Answer:   pancytopenia, rule out liver disease    Order Specific Question:   Preferred imaging location?    Answer:   [facility]  . CBC with Differential (Cancer Center Only)    Standing Status:   Future    Standing Expiration Date:   08/17/2020  . Save Smear (SSMR)    Standing Status:   Future    Standing Expiration Date:   07/13/2020  . CMP ([facility] only)    Standing Status:   Future    Standing Expiration Date:   08/17/2020  . Lactate dehydrogenase    Standing Status:   Future    Standing Expiration Date:   08/17/2020  . ANA, IFA (with reflex)    Standing Status:   Future    Standing Expiration Date:   07/13/2020  . Ambulatory referral to Gastroenterology    Referral Priority:   Routine    Referral Type:   Consultation    Referral Reason:   Specialty Services Required    Referred to Provider:   Lavena Bullion, DO    Number of Visits Requested:   1   The total time spent in the encounter was 30 minutes, including face-to-face time with the patient, review of various tests results, order additional studies/medications, documentation, and coordination of care plan.   All questions were answered. The patient knows to call the clinic with any problems, questions or concerns. No barriers to learning was detected.  Return in 6 months for labs and clinic follow-up.  Tish Men, MD 4/19/20219:22 AM  CHIEF COMPLAINT: "I am doing fine"  INTERVAL HISTORY: Mr. Hudspeth returns clinic for follow-up of mild chronic pancytopenia.  Patient reports that he  has been doing well since last visit, and the denies any constitutional symptoms, recurrent and frequent infections, or requiring frequent antibiotic usage.  He had lithotripsy for kidney stone.  He reports occasional trace blood in the nasal secretions when he blows his nose, but he denies any persistent or recurrent epistaxis.  He has chronic allergy, for which he requires weekly allergy shots for the past 2 years.  He denies  any other complaint today.  REVIEW OF SYSTEMS:   Constitutional: ( - ) fevers, ( - )  chills , ( - ) night sweats Eyes: ( - ) blurriness of vision, ( - ) double vision, ( - ) watery eyes Ears, nose, mouth, throat, and face: ( - ) mucositis, ( - ) sore throat Respiratory: ( - ) cough, ( - ) dyspnea, ( - ) wheezes Cardiovascular: ( - ) palpitation, ( - ) chest discomfort, ( - ) lower extremity swelling Gastrointestinal:  ( - ) nausea, ( - ) heartburn, ( - ) change in bowel habits Skin: ( - ) abnormal skin rashes Lymphatics: ( - ) new lymphadenopathy, ( - ) easy bruising Neurological: ( - ) numbness, ( - ) tingling, ( - ) new weaknesses Behavioral/Psych: ( - ) mood change, ( - ) new changes  All other systems were reviewed with the patient and are negative.  SUMMARY OF ONCOLOGIC HISTORY: Oncology History   No history exists.    I have reviewed the past medical history, past surgical history, social history and family history with the patient and they are unchanged from previous note.  ALLERGIES:  is allergic to aspirin.  MEDICATIONS:  Current Outpatient Medications  Medication Sig Dispense Refill  . albuterol (PROVENTIL HFA;VENTOLIN HFA) 108 (90 Base) MCG/ACT inhaler Inhale into the lungs every 6 (six) hours as needed.     Marland Kitchen atorvastatin (LIPITOR) 10 MG tablet Take 10 mg by mouth every evening.   3  . budesonide (PULMICORT FLEXHALER) 180 MCG/ACT inhaler Inhale 2 puffs into the lungs daily.    . cetirizine (ZYRTEC) 10 MG tablet Take 10 mg by mouth every evening.    Marland Kitchen HYDROcodone-acetaminophen (NORCO/VICODIN) 5-325 MG tablet Take 1 tablet by mouth every 4 (four) hours as needed for moderate pain. 10 tablet 0  . loratadine (CLARITIN) 10 MG tablet Take 1 tablet (10 mg total) by mouth daily. (Patient taking differently: Take 10 mg by mouth every evening. ) 30 tablet 1  . metFORMIN (GLUCOPHAGE) 500 MG tablet every evening.   1  . montelukast (SINGULAIR) 10 MG tablet Take 10 mg by mouth at  bedtime.     No current facility-administered medications for this visit.    PHYSICAL EXAMINATION: ECOG PERFORMANCE STATUS: 0 - Asymptomatic  Today's Vitals   07/14/19 0854  BP: 124/68  Pulse: 64  Resp: 18  Temp: (!) 97.5 F (36.4 C)  TempSrc: Temporal  SpO2: 100%  Weight: 193 lb 6.4 oz (87.7 kg)  Height: _0  (1.702 m)  PainSc: 0-No pain   Body mass index is 30.29 kg/m.  Filed Weights   07/14/19 0854  Weight: 193 lb 6.4 oz (87.7 kg)    GENERAL: alert, no distress and comfortable SKIN: skin color, texture, turgor are normal, no rashes or significant lesions EYES: conjunctiva are pink and non-injected, sclera clear OROPHARYNX: no exudate, no erythema; lips, buccal mucosa, and tongue normal  NECK: supple, non-tender LYMPH:  no palpable lymphadenopathy in the cervical LUNGS: clear to auscultation with normal breathing effort HEART: regular rate &  rhythm and no murmurs and no lower extremity edema ABDOMEN: soft, non-tender, non-distended, normal bowel sounds Musculoskeletal: no cyanosis of digits and no clubbing  PSYCH: alert & oriented x 3, fluent speech  LABORATORY DATA:  I have reviewed the data as listed    Component Value Date/Time   NA 143 07/14/2019 0827   K 3.6 07/14/2019 0827   CL 107 07/14/2019 0827   CO2 28 07/14/2019 0827   GLUCOSE 128 (H) 07/14/2019 0827   BUN 19 07/14/2019 0827   CREATININE 1.05 07/14/2019 0827   CALCIUM 9.1 07/14/2019 0827   PROT 6.7 07/14/2019 0827   ALBUMIN 4.2 07/14/2019 0827   AST 17 07/14/2019 0827   ALT 21 07/14/2019 0827   ALKPHOS 58 07/14/2019 0827   BILITOT 0.4 07/14/2019 0827   GFRNONAA >60 07/14/2019 0827   GFRAA >60 07/14/2019 0827    No results found for: SPEP, UPEP  Lab Results  Component Value Date   WBC 3.4 (L) 07/14/2019   NEUTROABS 1.5 (L) 07/14/2019   HGB 12.4 (L) 07/14/2019   HCT 37.9 (L) 07/14/2019   MCV 95.0 07/14/2019   PLT 98 (L) 07/14/2019      Chemistry      Component Value Date/Time    NA 143 07/14/2019 0827   K 3.6 07/14/2019 0827   CL 107 07/14/2019 0827   CO2 28 07/14/2019 0827   BUN 19 07/14/2019 0827   CREATININE 1.05 07/14/2019 0827      Component Value Date/Time   CALCIUM 9.1 07/14/2019 0827   ALKPHOS 58 07/14/2019 0827   AST 17 07/14/2019 0827   ALT 21 07/14/2019 0827   BILITOT 0.4 07/14/2019 0827       RADIOGRAPHIC STUDIES: I have personally reviewed the radiological images as listed below and agreed with the findings in the report. DG Abd 1 View  Result Date: 06/23/2019 CLINICAL DATA:  Right kidney stone. Evaluation prior to lithotripsy. EXAM: ABDOMEN - 1 VIEW COMPARISON:  None. FINDINGS: 7 mm stone in the upper/mid portion of the right kidney. No other definite renal calculi. Limited evaluation for renal calculi due to bowel gas and stool overlying the kidneys. Indeterminate 14 mm density just lateral to the L4 right transverse process. Small calcifications in the pelvis likely represent phleboliths. Nonobstructive bowel gas pattern. IMPRESSION: 1. 7 mm stone in the right kidney and unchanged from the exam on 06/02/2019. 2. Indeterminate 14 mm density just lateral to the L4 right transverse process. This is probably too large for a right ureter stone and likely represents a structure outside of the urinary tract. Electronically Signed   By: Markus Daft M.D.   On: 06/23/2019 08:12

## 2019-07-28 ENCOUNTER — Ambulatory Visit (INDEPENDENT_AMBULATORY_CARE_PROVIDER_SITE_OTHER): Payer: Managed Care, Other (non HMO)

## 2019-07-28 ENCOUNTER — Other Ambulatory Visit: Payer: Self-pay

## 2019-07-28 DIAGNOSIS — D61818 Other pancytopenia: Secondary | ICD-10-CM | POA: Diagnosis not present

## 2019-07-29 ENCOUNTER — Telehealth: Payer: Self-pay | Admitting: *Deleted

## 2019-07-29 NOTE — Telephone Encounter (Signed)
As noted below by Dr. Dion Body, I left a message for patient stating the abdominal US was normal. We will continue to monitor your counts as discussed. I instructed the patient to call the office if he had any further questions or concerns.

## 2019-07-29 NOTE — Telephone Encounter (Signed)
-----   Message from Arthur Holms, MD sent at 07/29/2019  8:37 AM EDT ----- Truddie Hidden,  Can you let the patient know that his abdominal US is normal? We will monitor his counts as discussed.  Thanks.  GZ  ----- Message ----- From: Interface, Rad Results In Sent: 07/28/2019   4:13 PM EDT To: Arthur Holms, MD

## 2019-08-11 ENCOUNTER — Other Ambulatory Visit: Payer: Self-pay

## 2019-08-11 ENCOUNTER — Ambulatory Visit: Payer: Managed Care, Other (non HMO) | Admitting: Gastroenterology

## 2019-08-11 ENCOUNTER — Encounter: Payer: Self-pay | Admitting: Gastroenterology

## 2019-08-11 VITALS — BP 116/72 | HR 71 | Temp 98.4°F | Ht 67.0 in | Wt 193.4 lb

## 2019-08-11 DIAGNOSIS — Z1211 Encounter for screening for malignant neoplasm of colon: Secondary | ICD-10-CM

## 2019-08-11 DIAGNOSIS — D61818 Other pancytopenia: Secondary | ICD-10-CM

## 2019-08-11 MED ORDER — CLENPIQ 10-3.5-12 MG-GM -GM/160ML PO SOLN: 1.0000 | Freq: Once | ORAL | 0 refills | Status: AC

## 2019-08-11 NOTE — Patient Instructions (Signed)
If you are age 53 or older, your body mass index should be between 23-30. Your Body mass index is 30.29 kg/m. If this is out of the aforementioned range listed, please consider follow up with your Primary Care Provider.  If you are age 44 or younger, your body mass index should be between 19-25. Your Body mass index is 30.29 kg/m. If this is out of the aformentioned range listed, please consider follow up with your Primary Care Provider.   You have been scheduled for a colonoscopy. Please follow instructions regarding how to prepare for the colonoscopy and pick up your prep as soon as possible.

## 2019-08-11 NOTE — Progress Notes (Signed)
Chief Complaint: Pancytopenia  Referring Provider:     Tish Men, MD  HPI:    Glen Clark is a 54 y.o. male referred to the Gastroenterology Clinic for evaluation of pancytopenia and routine CRC screening.  Was recently seen by Dr. Maylon Peppers in the Hematology/Oncology clinic on 07/14/2019 for evaluation of pancytopenia of unclear etiology.  Pancytopenia relatively stable since 2018.  No plan for BM biopsy at this time.  Otherwise, no active GI symptoms.  No previous colonoscopy.  No known family history of CRC, GI malignancy, IBD, hepatobiliary or pancreatic disease.  -05/12/2019: H/H 12.6/37.4, MCV/RDW 93.5/12, PLT 109, WBC 2.8.  Protein 6.3 otherwise normal CMP.  Normal/negative copper, HIV, HBV, INR, COVID-19 -07/14/2019: H/H 12.4/37.9, MCV/RDW 95/12.6, PLT 98, WBC 3.4.  Normal CMP.  Ferritin 124, iron 67, TIBC 264, sat 25% -07/28/2019: RUQ [place]: Normal  Received second dose of COVID-19 vaccine today.   Past Medical History:  Diagnosis Date  . Asthma   . Diabetes mellitus without complication ([place])   . History of bronchitis 06/2015  . History of hemorrhoids   . History of seasonal allergies   . Hyperlipidemia   . Right hydrocele      Past Surgical History:  Procedure Laterality Date  . APPENDECTOMY    . EXTRACORPOREAL SHOCK WAVE LITHOTRIPSY Right 06/23/2019   Procedure: EXTRACORPOREAL SHOCK WAVE LITHOTRIPSY (ESWL);  Surgeon: Lucas Mallow, MD;  Location: [facility];  Service: Urology;  Laterality: Right;  . HYDROCELE EXCISION Right 02/18/2018   Procedure: HYDROCELECTOMY ADULT;  Surgeon: Cleon Gustin, MD;  Location: [facility];  Service: Urology;  Laterality: Right;   Family History  Problem Relation Age of Onset  . Diabetes Mother   . Heart disease Mother   . Amblyopia Neg Hx   . Blindness Neg Hx   . Cataracts Neg Hx   . Glaucoma Neg Hx   . Macular degeneration Neg Hx   . Retinal detachment Neg Hx   . Strabismus Neg  Hx   . Retinitis pigmentosa Neg Hx   . Colon cancer Neg Hx   . Esophageal cancer Neg Hx    Social History   Tobacco Use  . Smoking status: Never Smoker  . Smokeless tobacco: Never Used  Substance Use Topics  . Alcohol use: Yes    Comment: occ  . Drug use: No   Current Outpatient Medications  Medication Sig Dispense Refill  . albuterol (PROVENTIL HFA;VENTOLIN HFA) 108 (90 Base) MCG/ACT inhaler Inhale into the lungs every 6 (six) hours as needed.     Marland Kitchen atorvastatin (LIPITOR) 10 MG tablet Take 10 mg by mouth every evening.   3  . cetirizine (ZYRTEC) 10 MG tablet Take 10 mg by mouth every evening.    . metFORMIN (GLUCOPHAGE) 500 MG tablet every evening.   1  . montelukast (SINGULAIR) 10 MG tablet Take 10 mg by mouth at bedtime.    Marland Kitchen PULMICORT FLEXHALER 90 MCG/ACT inhaler Inhale 2 puffs into the lungs 2 (two) times daily.     No current facility-administered medications for this visit.   Allergies  Allergen Reactions  . Aspirin Other (See Comments)    Feel like going to pass out     Review of Systems: All systems reviewed and negative except where noted in HPI.     Physical Exam:    Wt Readings from Last 3 Encounters:  08/11/19 193 lb 6  oz (87.7 kg)  07/14/19 193 lb 6.4 oz (87.7 kg)  06/23/19 186 lb 6.4 oz (84.6 kg)    BP 116/72   Pulse 71   Temp 98.4 F (36.9 C)   Ht 5\' 7"  (1.702 m)   Wt 193 lb 6 oz (87.7 kg)   BMI 30.29 kg/m  Constitutional:  Pleasant, in no acute distress. Psychiatric: Normal mood and affect. Behavior is normal. EENT: Pupils normal.  Conjunctivae are normal. No scleral icterus. Neck supple. No cervical LAD. Cardiovascular: Normal rate, regular rhythm. No edema Pulmonary/chest: Effort normal and breath sounds normal. No wheezing, rales or rhonchi. Abdominal: Soft, nondistended, nontender. Bowel sounds active throughout. There are no masses palpable. No hepatomegaly. Neurological: Alert and oriented to person place and time. Skin: Skin is  warm and dry. No rashes noted.   ASSESSMENT AND PLAN;   1) CRC screening -Due for age-appropriate, average risk CRC screening. -Schedule colonoscopy  2) Pancytopenia: -Unclear etiology. No e/o liver disease on serologic studies or recent ultrasound.  Follows in the Hematology clinic.  The indications, risks, and benefits of colonoscopy were explained to the patient in detail. Risks include but are not limited to bleeding, perforation, adverse reaction to medications, and cardiopulmonary compromise. Sequelae include but are not limited to the possibility of surgery, hospitalization, and mortality. The patient verbalized understanding and wished to proceed. All questions answered, referred to the scheduler and bowel prep ordered. Further recommendations pending results of the exam.    , DO, FACG  08/11/2019, 2:01 PM   08/13/2019, MD

## 2019-08-15 ENCOUNTER — Encounter: Payer: Self-pay | Admitting: Gastroenterology

## 2019-08-29 ENCOUNTER — Encounter: Payer: Managed Care, Other (non HMO) | Admitting: Gastroenterology

## 2020-01-09 ENCOUNTER — Other Ambulatory Visit: Payer: Self-pay | Admitting: Family

## 2020-01-09 DIAGNOSIS — D61818 Other pancytopenia: Secondary | ICD-10-CM

## 2020-01-09 DIAGNOSIS — E538 Deficiency of other specified B group vitamins: Secondary | ICD-10-CM

## 2020-01-09 DIAGNOSIS — D696 Thrombocytopenia, unspecified: Secondary | ICD-10-CM

## 2020-01-09 DIAGNOSIS — D72819 Decreased white blood cell count, unspecified: Secondary | ICD-10-CM

## 2020-01-12 ENCOUNTER — Inpatient Hospital Stay: Payer: Managed Care, Other (non HMO) | Attending: Hematology & Oncology

## 2020-01-12 ENCOUNTER — Inpatient Hospital Stay: Payer: Managed Care, Other (non HMO) | Admitting: Family

## 2020-02-04 ENCOUNTER — Other Ambulatory Visit: Payer: Self-pay | Admitting: Gastroenterology

## 2020-02-04 DIAGNOSIS — D696 Thrombocytopenia, unspecified: Secondary | ICD-10-CM

## 2020-03-01 ENCOUNTER — Other Ambulatory Visit: Payer: Managed Care, Other (non HMO)

## 2020-03-08 ENCOUNTER — Ambulatory Visit
Admission: RE | Admit: 2020-03-08 | Discharge: 2020-03-08 | Disposition: A | Discharge status: Home / Self Care | Payer: Managed Care, Other (non HMO) | Source: Ambulatory Visit | Attending: Gastroenterology | Admitting: Gastroenterology

## 2020-03-08 DIAGNOSIS — D696 Thrombocytopenia, unspecified: Secondary | ICD-10-CM

## 2021-02-27 IMAGING — DX DG ABDOMEN 1V
1 series · 1 of 1 positions shown · non-contrast
Comparison: None.

CLINICAL DATA: Right kidney stone. Evaluation prior to lithotripsy.

EXAM:
ABDOMEN - 1 VIEW

[abdomen kub]
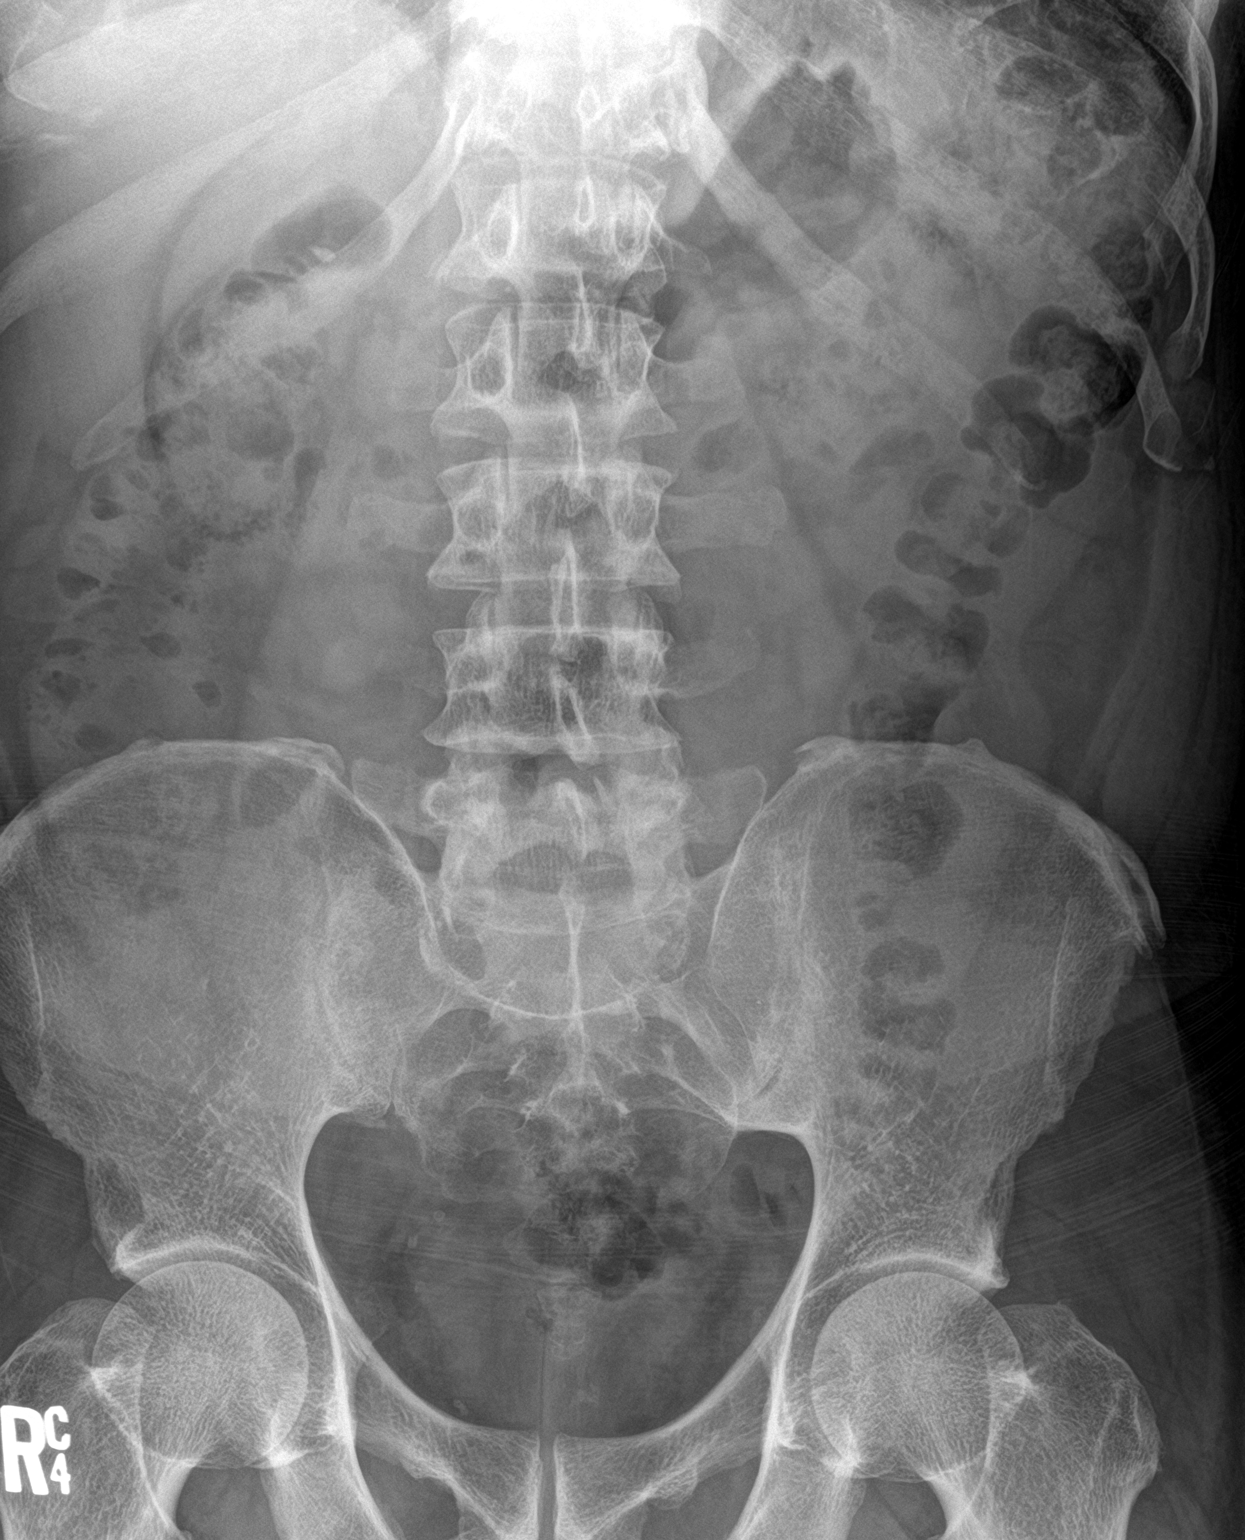

[1 of 1 positions shown; findings below may reference images not displayed]

FINDINGS: 7 mm stone in the upper/mid portion of the right kidney. No other
definite renal calculi. Limited evaluation for renal calculi due to
bowel gas and stool overlying the kidneys. Indeterminate 14 mm
density just lateral to the L4 right transverse process. Small
calcifications in the pelvis likely represent phleboliths.
Nonobstructive bowel gas pattern.
IMPRESSION: 1. 7 mm stone in the right kidney and unchanged from the exam on
06/02/2019.
[DATE]. Indeterminate 14 mm density just lateral to the L4 right
transverse process. This is probably too large for a right ureter
stone and likely represents a structure outside of the urinary
tract.

## 2021-11-13 IMAGING — US US ABDOMEN LIMITED
1 series · 14 of 25 positions shown · non-contrast
Comparison: Abdomen ultrasound 07/28/2019.

CLINICAL DATA: 54-year-old male with thrombocytopenia. Query
cirrhosis.

EXAM:
ULTRASOUND ABDOMEN LIMITED RIGHT UPPER QUADRANT

[Series 1: us abdomen limited · 0.20mm/px · 14 of 42 slices shown]
[im 1/42]
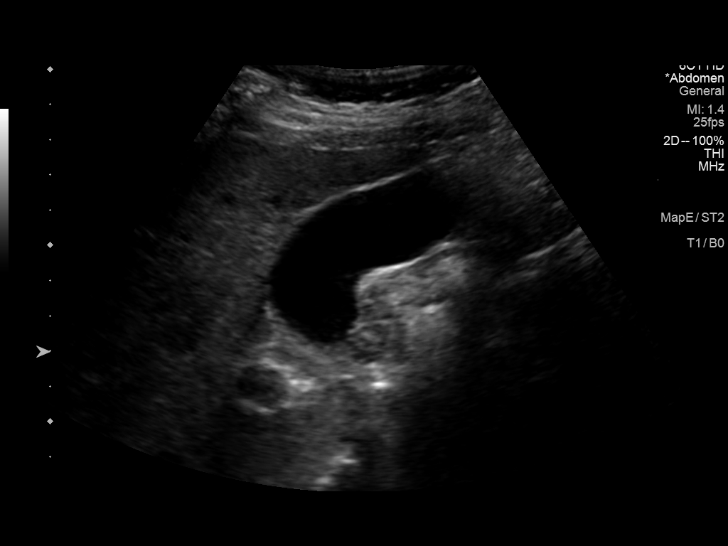
[im 4/42]
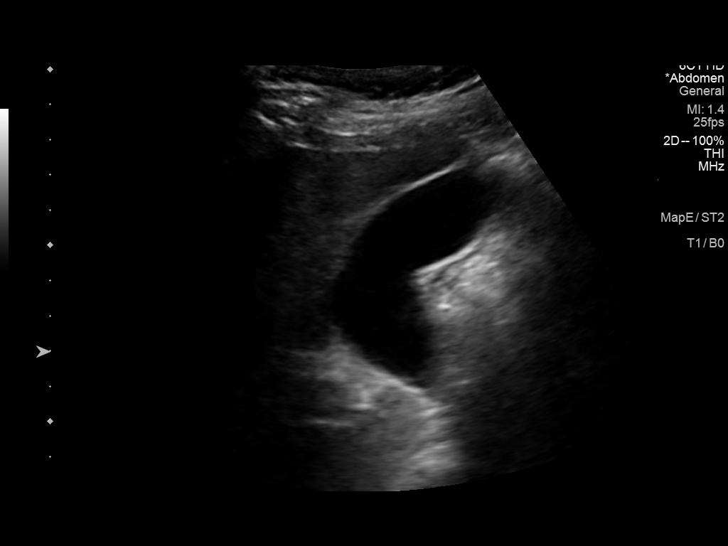
[im 7/42]
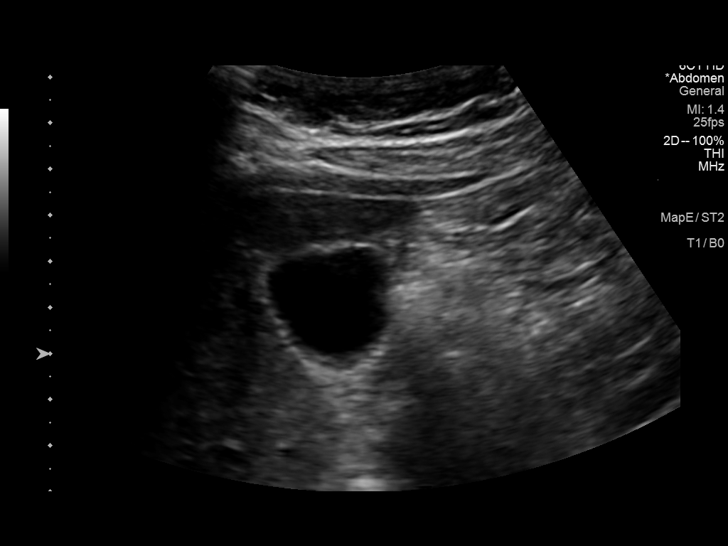
[im 11/42]
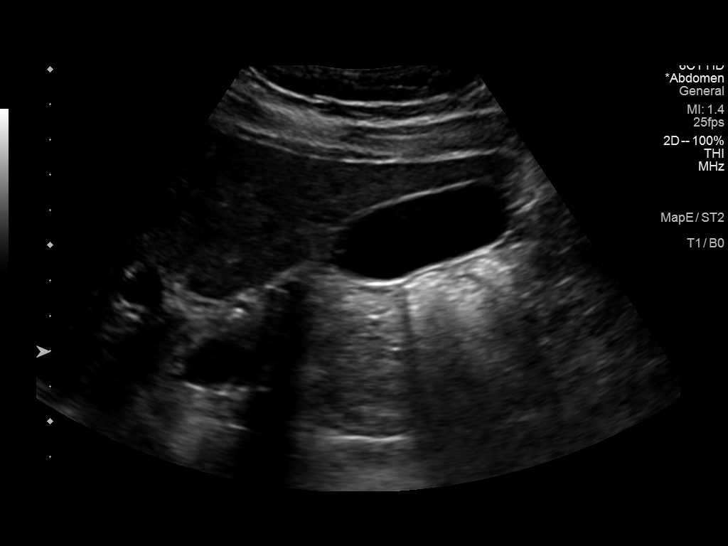
[im 14/42]
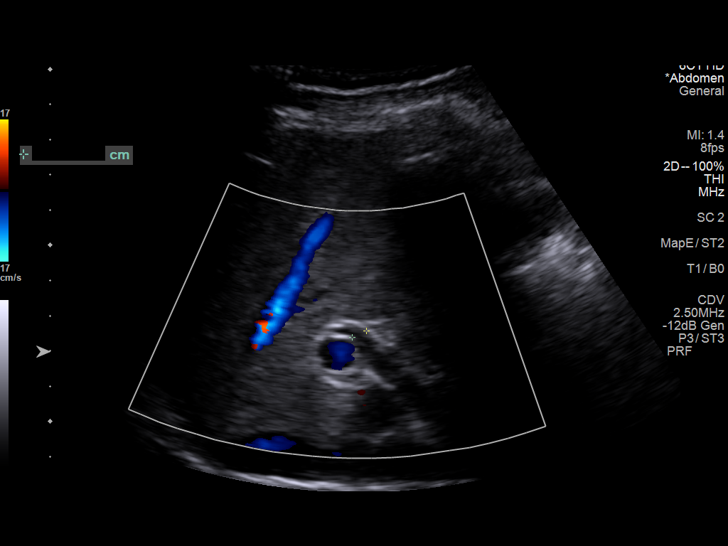
[im 16/42]
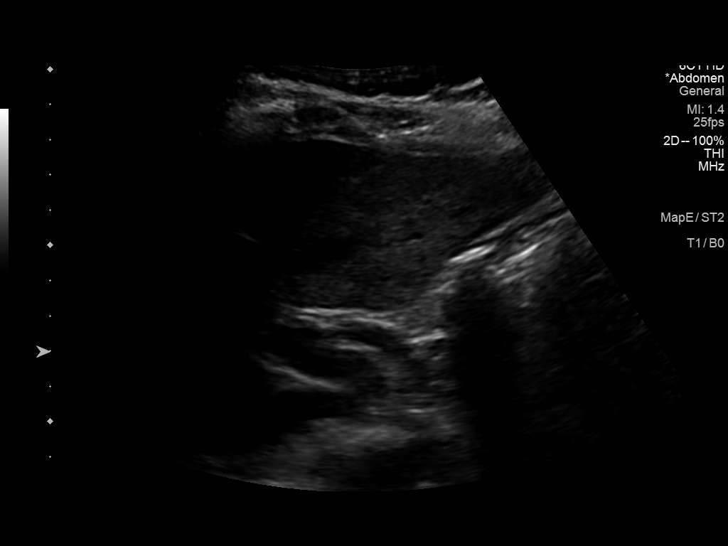
[im 19/42]
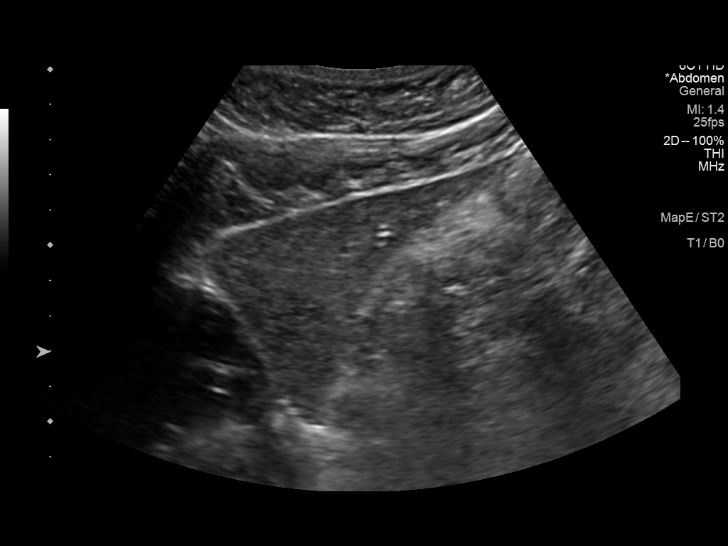
[im 23/42]
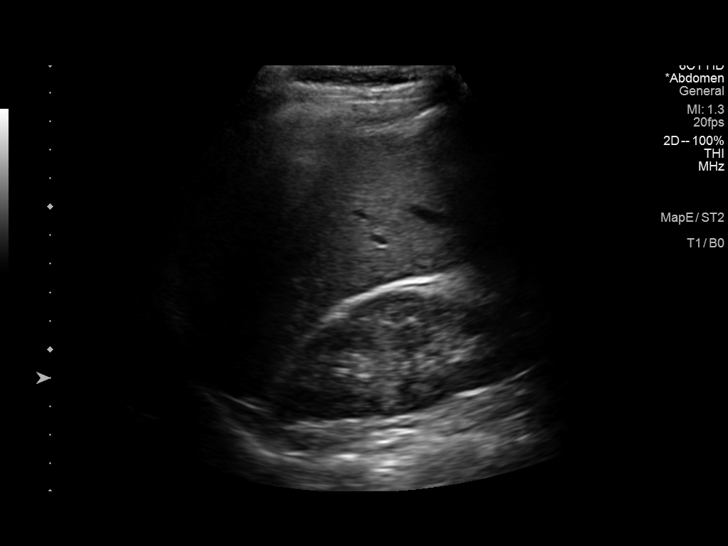
[im 26/42]
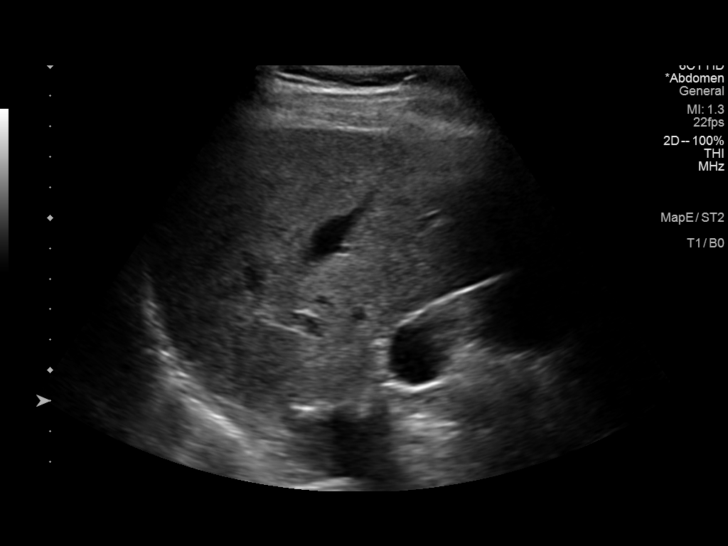
[im 28/42]
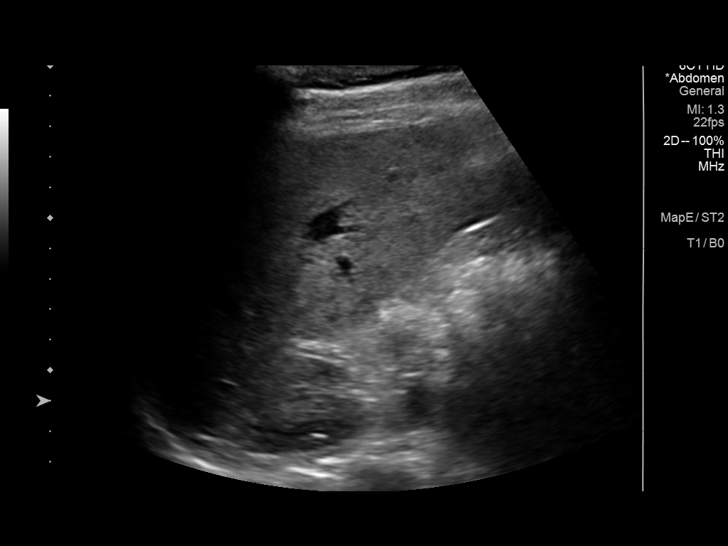
[im 31/42]
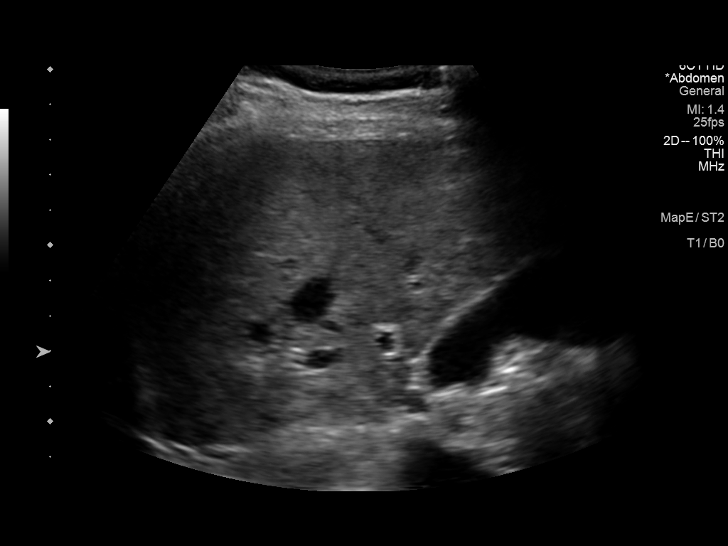
[im 35/42]
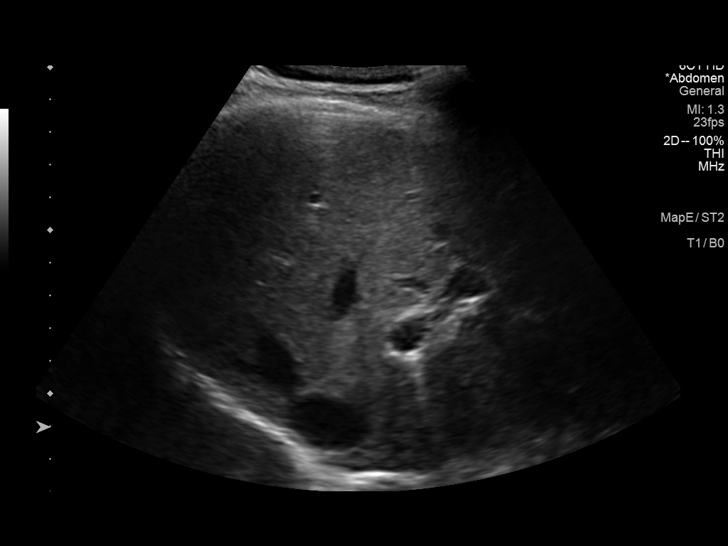
[im 38/42]
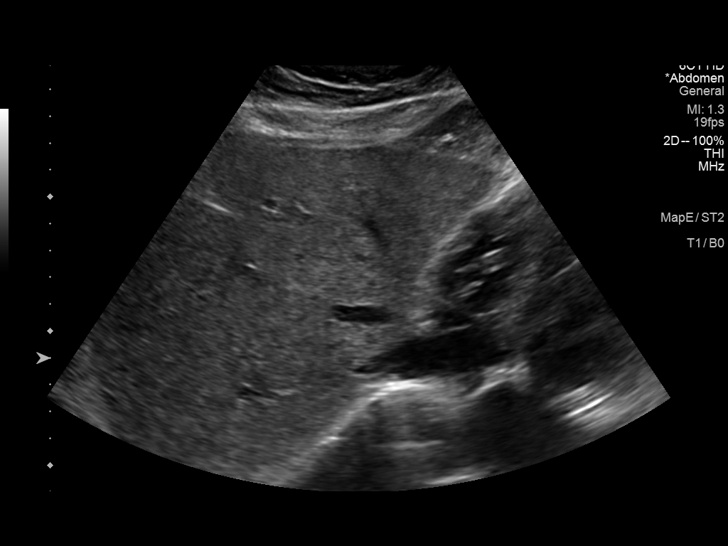
[im 42/42]
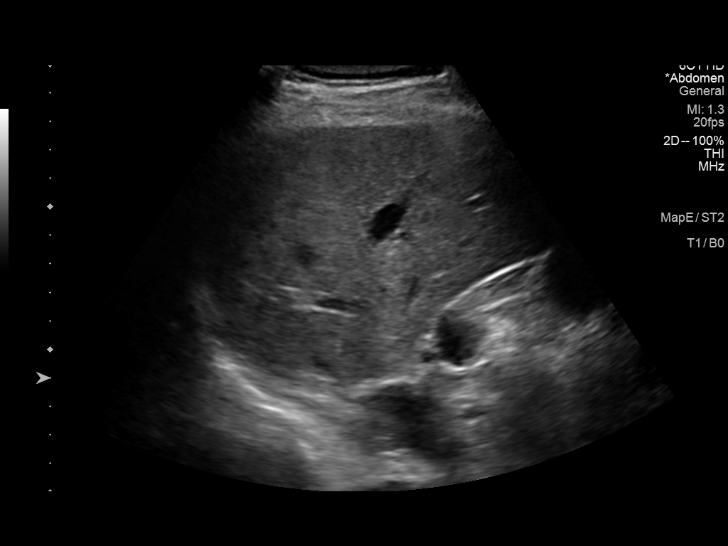

[14 of 25 positions shown; findings below may reference images not displayed]

FINDINGS: Gallbladder:

No gallstones or wall thickening visualized. No sonographic Murphy
sign noted by sonographer.

Common bile duct:

Diameter: 4 mm, normal.

Liver:

Liver echogenicity and contour remain within normal limits. No
discrete liver lesion. No intrahepatic biliary ductal dilatation.
Portal vein is patent on color Doppler imaging with normal direction
of blood flow towards the liver.

Other: Negative visible right kidney.  No free fluid.
IMPRESSION: Ultrasound appearance of the liver remains within normal limits, no
strong evidence of cirrhosis.
